# Patient Record
Sex: Male | Born: 1985
Health system: Southern US, Community
[De-identification: ages and names within clinical notes are randomized; demographics above are authoritative.]

## PROBLEM LIST (undated history)

## (undated) DIAGNOSIS — R011 Cardiac murmur, unspecified: Secondary | ICD-10-CM

## (undated) HISTORY — PX: VASECTOMY: SHX75

---

## 2014-07-21 ENCOUNTER — Other Ambulatory Visit: Payer: Self-pay

## 2014-07-21 ENCOUNTER — Emergency Department
Admission: EM | Admit: 2014-07-21 | Discharge: 2014-07-21 | Disposition: A | Discharge status: Home / Self Care | Payer: BLUE CROSS/BLUE SHIELD | Attending: Emergency Medicine | Admitting: Emergency Medicine

## 2014-07-21 ENCOUNTER — Emergency Department: Payer: BLUE CROSS/BLUE SHIELD

## 2014-07-21 ENCOUNTER — Encounter: Payer: Self-pay | Admitting: Emergency Medicine

## 2014-07-21 DIAGNOSIS — R0789 Other chest pain: Secondary | ICD-10-CM | POA: Insufficient documentation

## 2014-07-21 DIAGNOSIS — Z720 Tobacco use: Secondary | ICD-10-CM | POA: Diagnosis not present

## 2014-07-21 DIAGNOSIS — M25512 Pain in left shoulder: Secondary | ICD-10-CM | POA: Diagnosis not present

## 2014-07-21 DIAGNOSIS — R0782 Intercostal pain: Secondary | ICD-10-CM

## 2014-07-21 HISTORY — DX: Cardiac murmur, unspecified: R01.1

## 2014-07-21 LAB — TROPONIN I: Troponin I: <0.03 ng/mL (ref <0.031)

## 2014-07-21 LAB — CBC
HEMATOCRIT: 43.5 % (ref 40.0–52.0)
HEMOGLOBIN: 14.9 g/dL (ref 13.0–18.0)
MCH: 31.2 pg (ref 26.0–34.0)
MCHC: 34.1 g/dL (ref 32.0–36.0)
MCV: 91.5 fL (ref 80.0–100.0)
PLATELETS: 219 10*3/uL (ref 150–440)
RBC: 4.76 MIL/uL (ref 4.40–5.90)
RDW: 12.3 % (ref 11.5–14.5)
WBC: 6.8 10*3/uL (ref 3.8–10.6)

## 2014-07-21 LAB — BASIC METABOLIC PANEL
Anion gap: 9 (ref 5–15)
BUN: 14 mg/dL (ref 6–20)
CO2: 26 mmol/L (ref 22–32)
Calcium: 9.2 mg/dL (ref 8.9–10.3)
Chloride: 103 mmol/L (ref 101–111)
Creatinine, Ser: 0.86 mg/dL (ref 0.61–1.24)
GFR calc Af Amer: >60 mL/min (ref >60)
GFR calc non Af Amer: >60 mL/min (ref >60)
Glucose, Bld: 101 mg/dL — ABNORMAL HIGH (ref 65–99)
Potassium: 4.1 mmol/L (ref 3.5–5.1)
Sodium: 138 mmol/L (ref 135–145)

## 2014-07-21 NOTE — ED Provider Notes (Signed)
[facility] Emergency Department Provider Note  Time seen: 2:16 PM  I have reviewed the triage vital signs and the nursing notes.   HISTORY  Chief Complaint Chest Pain    HPI Jason Stanton is a 29 y.o. male with no past medical problems presents the emergency department with 3 days of right-sided chest pressure/pain. According to the patient for the past 3 days he has had a pain in his right chest worse with movement. Denies any shortness of breath, cough, no pleuritic chest pain. Denies any leg swelling or pain. Patient states his wife made him come to get evaluated to be safe. States his pain as mild to moderate fairly constant dull type pain, somewhat sharp when moving. Patient also has some left shoulder pain which she states is unrelated after hitting his shoulder 2 days ago. Describes his left shoulder pain as mild, moderate when he moves it.     Past Medical History  Diagnosis Date  . Heart murmur     There are no active problems to display for this patient.   History reviewed. No pertinent past surgical history.  No current outpatient prescriptions on file.  Allergies Stadol  No family history on file.  Social History History  Substance Use Topics  . Smoking status: Current Every Day Smoker  . Smokeless tobacco: Not on file  . Alcohol Use: Yes    Review of Systems Constitutional: Negative for fever. Cardiovascular: Positive for right chest pain. Respiratory: Negative for shortness of breath. Gastrointestinal: Negative for abdominal pain Musculoskeletal: Negative for back pain.  10-point ROS otherwise negative.  ____________________________________________   PHYSICAL EXAM:  VITAL SIGNS: ED Triage Vitals  Enc Vitals Group     BP 07/21/14 1242 146/82 mmHg     Pulse Rate 07/21/14 1242 76     Resp 07/21/14 1242 18     Temp 07/21/14 1242 98.3 F (36.8 C)     Temp Source 07/21/14 1242 Oral     SpO2 07/21/14 1242 97 %   Weight 07/21/14 1242 208 lb (94.348 kg)     Height 07/21/14 1242  (1.803 m)     Head Cir --      Peak Flow --      Pain Score 07/21/14 1300 6     Pain Loc --      Pain Edu? --      Excl. in GC? --     Constitutional: Alert and oriented. Well appearing and in no distress. ENT   Mouth/Throat: Mucous membranes are moist. Cardiovascular: Normal rate, regular rhythm. No murmur Respiratory: Normal respiratory effort without tachypnea nor retractions. Breath sounds are clear and equal bilaterally. No wheezes/rales/rhonchi. Moderate tenderness palpation of the right chest with chest compression. Gastrointestinal: Soft and nontender. No distention.   Musculoskeletal: Nontender with normal range of motion in all extremities. No lower extremity  edema. Neurologic:  Normal speech and language. No gross focal neurologic deficits  Skin:  Skin is warm, dry and intact.  Psychiatric: Mood and affect are normal. Speech and behavior are normal.   ____________________________________________    EKG  EKG reviewed and interpreted by myself shows normal sinus rhythm at 80 bpm, narrow QRS, normal axis, normal intervals, no ST changes noted. Overall normal EKG.  ____________________________________________    RADIOLOGY  Chest x-ray within normal limits Left shoulder x-ray negative.  ____________________________________________   INITIAL IMPRESSION / ASSESSMENT AND PLAN / ED COURSE  Pertinent labs & imaging results that were available during my  care of the patient were reviewed by me and considered in my medical decision making (see chart for details).  Patient here for 3 days of right-sided chest pain, somewhat worse with moving. Chest pain is reproducible on exam. Labs are within normal limits including negative cardiac enzymes. Chest x-ray and EKG are also normal. I discussed with the patient likely musculoskeletal cause for his pain. Patient will take Motrin at home, follow-up with his  primary care doctor not improved.   ____________________________________________   FINAL CLINICAL IMPRESSION(S) / ED DIAGNOSES  Chest pain Left shoulder pain Musca skeletal pain   Minna AntisKevin Shanti Eichel, MD 07/21/14 1419

## 2014-07-21 NOTE — ED Notes (Signed)
Patient reports pain to right side of chest x3 days. States he hit his left shoulder a few days ago, has h/o pain to same.

## 2014-07-21 NOTE — Discharge Instructions (Signed)

## 2015-06-08 ENCOUNTER — Emergency Department
Admission: EM | Admit: 2015-06-08 | Discharge: 2015-06-08 | Disposition: A | Discharge status: Home / Self Care | Payer: Managed Care, Other (non HMO) | Attending: Emergency Medicine | Admitting: Emergency Medicine

## 2015-06-08 ENCOUNTER — Emergency Department: Payer: Managed Care, Other (non HMO)

## 2015-06-08 ENCOUNTER — Encounter: Payer: Self-pay | Admitting: Emergency Medicine

## 2015-06-08 DIAGNOSIS — Y939 Activity, unspecified: Secondary | ICD-10-CM | POA: Insufficient documentation

## 2015-06-08 DIAGNOSIS — F17200 Nicotine dependence, unspecified, uncomplicated: Secondary | ICD-10-CM | POA: Diagnosis not present

## 2015-06-08 DIAGNOSIS — Y929 Unspecified place or not applicable: Secondary | ICD-10-CM | POA: Insufficient documentation

## 2015-06-08 DIAGNOSIS — X58XXXA Exposure to other specified factors, initial encounter: Secondary | ICD-10-CM | POA: Insufficient documentation

## 2015-06-08 DIAGNOSIS — S8991XA Unspecified injury of right lower leg, initial encounter: Secondary | ICD-10-CM | POA: Diagnosis present

## 2015-06-08 DIAGNOSIS — Y999 Unspecified external cause status: Secondary | ICD-10-CM | POA: Insufficient documentation

## 2015-06-08 DIAGNOSIS — S86911A Strain of unspecified muscle(s) and tendon(s) at lower leg level, right leg, initial encounter: Secondary | ICD-10-CM

## 2015-06-08 DIAGNOSIS — S86811A Strain of other muscle(s) and tendon(s) at lower leg level, right leg, initial encounter: Secondary | ICD-10-CM | POA: Insufficient documentation

## 2015-06-08 MED ORDER — MELOXICAM 15 MG PO TABS: 15.0000 mg | ORAL_TABLET | Freq: Every day | ORAL | Status: DC

## 2015-06-08 NOTE — ED Notes (Signed)
Developed pain to right knee since Sunday w/o injury

## 2015-06-08 NOTE — ED Provider Notes (Signed)
[facility] Emergency Department Provider Note  ____________________________________________  Time seen: Approximately 2:13 PM  I have reviewed the triage vital signs and the nursing notes.   HISTORY  Chief Complaint Knee Pain    HPI Jason Stanton is a 30 y.o. male presents for evaluation of right knee pain 2 days with no known injury. Patient states that his knee pain is progressively gotten worse over the last 2 days after wrestling with a 75-year-old some carpet but doesn't feel like he didn't have any trauma to his knee.   Past Medical History  Diagnosis Date  . Heart murmur     There are no active problems to display for this patient.   History reviewed. No pertinent past surgical history.  Current Outpatient Rx  Name  Route  Sig  Dispense  Refill  . meloxicam (MOBIC) 15 MG tablet   Oral   Take 1 tablet (15 mg total) by mouth daily.   30 tablet   0     Allergies Stadol  No family history on file.  Social History Social History  Substance Use Topics  . Smoking status: Current Every Day Smoker  . Smokeless tobacco: None  . Alcohol Use: Yes    Review of Systems Constitutional: No fever/chills Musculoskeletal: Positive for right knee pain. Skin: Negative for rash. Neurological: Negative for headaches, focal weakness or numbness.  10-point ROS otherwise negative.  ____________________________________________   PHYSICAL EXAM:  VITAL SIGNS: ED Triage Vitals  Enc Vitals Group     BP 06/08/15 1411 149/88 mmHg     Pulse Rate 06/08/15 1411 78     Resp 06/08/15 1411 18     Temp 06/08/15 1411 98.2 F (36.8 C)     Temp Source 06/08/15 1411 Oral     SpO2 06/08/15 1411 97 %     Weight 06/08/15 1411 214 lb (97.07 kg)     Height 06/08/15 1411  (1.803 m)     Head Cir --      Peak Flow --      Pain Score --      Pain Loc --      Pain Edu? --      Excl. in GC? --     Constitutional: Alert and oriented. Well appearing  and in no acute distress. Musculoskeletal: Minimal right knee tenderness with no edema or effusion noted. No ecchymosis or bruising present. Full range of motion. Neurologic:  Normal speech and language. No gross focal neurologic deficits are appreciated.  Skin:  Skin is warm, dry and intact. No rash noted. Psychiatric: Mood and affect are normal. Speech and behavior are normal.  ____________________________________________   LABS (all labs ordered are listed, but only abnormal results are displayed)  Labs Reviewed - No data to display ____________________________________________  EKG   ____________________________________________  RADIOLOGY  FINDINGS: No evidence of fracture, dislocation, or joint effusion. No evidence of arthropathy or other focal bone abnormality. Soft tissues are unremarkable.  IMPRESSION: Negative right knee radiographs. ____________________________________________   PROCEDURES  Procedure(s) performed: None  Critical Care performed: No  ____________________________________________   INITIAL IMPRESSION / ASSESSMENT AND PLAN / ED COURSE  Pertinent labs & imaging results that were available during my care of the patient were reviewed by me and considered in my medical decision making (see chart for details).  Acute right knee strain. Rx given for meloxicam 15 mg daily. [facility]. Patient voices no  other emergency medical complaints at this time. ____________________________________________   FINAL CLINICAL IMPRESSION(S) / ED DIAGNOSES  Final diagnoses:  Knee strain, right, initial encounter     This chart was dictated using voice recognition software/Dragon. Despite best efforts to proofread, errors can occur which can change the meaning. Any change was purely unintentional.   Evangeline Dakinharles M Beers, PA-C 06/08/15 1734  Charmayne Sheerharles M Beers, PA-C 06/08/15 1735  Jene Everyobert Kinner,  MD 06/11/15 [phone]

## 2016-08-16 ENCOUNTER — Emergency Department
Admission: EM | Admit: 2016-08-16 | Discharge: 2016-08-16 | Disposition: A | Discharge status: Home / Self Care | Payer: PRIVATE HEALTH INSURANCE | Attending: Emergency Medicine | Admitting: Emergency Medicine

## 2016-08-16 ENCOUNTER — Encounter: Payer: Self-pay | Admitting: *Deleted

## 2016-08-16 ENCOUNTER — Emergency Department: Payer: PRIVATE HEALTH INSURANCE

## 2016-08-16 DIAGNOSIS — N23 Unspecified renal colic: Secondary | ICD-10-CM | POA: Diagnosis not present

## 2016-08-16 DIAGNOSIS — F17210 Nicotine dependence, cigarettes, uncomplicated: Secondary | ICD-10-CM | POA: Insufficient documentation

## 2016-08-16 DIAGNOSIS — R109 Unspecified abdominal pain: Secondary | ICD-10-CM | POA: Diagnosis present

## 2016-08-16 LAB — CBC
HCT: 45.1 % (ref 40.0–52.0)
Hemoglobin: 15.9 g/dL (ref 13.0–18.0)
MCH: 32.5 pg (ref 26.0–34.0)
MCHC: 35.2 g/dL (ref 32.0–36.0)
MCV: 92.4 fL (ref 80.0–100.0)
PLATELETS: 241 10*3/uL (ref 150–440)
RBC: 4.88 MIL/uL (ref 4.40–5.90)
RDW: 12.8 % (ref 11.5–14.5)
WBC: 15.4 10*3/uL — ABNORMAL HIGH (ref 3.8–10.6)

## 2016-08-16 LAB — COMPREHENSIVE METABOLIC PANEL
ALBUMIN: 5 g/dL (ref 3.5–5.0)
ALT: 43 U/L (ref 17–63)
AST: 43 U/L — AB (ref 15–41)
Alkaline Phosphatase: 81 U/L (ref 38–126)
Anion gap: 11 (ref 5–15)
BUN: 21 mg/dL — AB (ref 6–20)
CHLORIDE: 105 mmol/L (ref 101–111)
CO2: 26 mmol/L (ref 22–32)
Calcium: 10.1 mg/dL (ref 8.9–10.3)
Creatinine, Ser: 1.56 mg/dL — ABNORMAL HIGH (ref 0.61–1.24)
GFR calc Af Amer: >60 mL/min (ref >60)
GFR calc non Af Amer: 59 mL/min — ABNORMAL LOW (ref >60)
GLUCOSE: 111 mg/dL — AB (ref 65–99)
Potassium: 3.8 mmol/L (ref 3.5–5.1)
SODIUM: 142 mmol/L (ref 135–145)
Total Bilirubin: 0.8 mg/dL (ref 0.3–1.2)
Total Protein: 8.3 g/dL — ABNORMAL HIGH (ref 6.5–8.1)

## 2016-08-16 LAB — URINALYSIS, COMPLETE (UACMP) WITH MICROSCOPIC
Bacteria, UA: NONE SEEN
Bilirubin Urine: NEGATIVE
GLUCOSE, UA: NEGATIVE mg/dL
KETONES UR: 5 mg/dL — AB
Leukocytes, UA: NEGATIVE
Nitrite: NEGATIVE
Protein, ur: 30 mg/dL — AB
Specific Gravity, Urine: 1.033 — ABNORMAL HIGH (ref 1.005–1.030)
Squamous Epithelial / LPF: NONE SEEN
pH: 5 (ref 5.0–8.0)

## 2016-08-16 MED ORDER — LORAZEPAM 2 MG/ML IJ SOLN
1.0000 mg | Freq: Once | INTRAMUSCULAR | Status: AC
  Administered 2016-08-16: 1 mg via INTRAVENOUS
  Filled 2016-08-16: qty 1

## 2016-08-16 MED ORDER — ONDANSETRON 4 MG PO TBDP: 4.0000 mg | ORAL_TABLET | Freq: Three times a day (TID) | ORAL | 0 refills | Status: DC | PRN

## 2016-08-16 MED ORDER — HYDROMORPHONE HCL 1 MG/ML IJ SOLN
INTRAMUSCULAR | Status: AC
  Filled 2016-08-16: qty 1

## 2016-08-16 MED ORDER — KETOROLAC TROMETHAMINE 30 MG/ML IJ SOLN
INTRAMUSCULAR | Status: AC
  Administered 2016-08-16: 30 mg via INTRAVENOUS
  Filled 2016-08-16: qty 1

## 2016-08-16 MED ORDER — OXYCODONE-ACETAMINOPHEN 7.5-325 MG PO TABS: 1.0000 | ORAL_TABLET | ORAL | 0 refills | Status: DC | PRN

## 2016-08-16 MED ORDER — TAMSULOSIN HCL 0.4 MG PO CAPS: 0.4000 mg | ORAL_CAPSULE | Freq: Every day | ORAL | 0 refills | Status: DC

## 2016-08-16 MED ORDER — KETOROLAC TROMETHAMINE 30 MG/ML IJ SOLN
30.0000 mg | Freq: Once | INTRAMUSCULAR | Status: AC
  Administered 2016-08-16: 30 mg via INTRAVENOUS

## 2016-08-16 MED ORDER — HYDROMORPHONE HCL 1 MG/ML IJ SOLN
0.5000 mg | Freq: Once | INTRAMUSCULAR | Status: AC
  Administered 2016-08-16: 0.5 mg via INTRAVENOUS
  Filled 2016-08-16: qty 1

## 2016-08-16 MED ORDER — ONDANSETRON HCL 4 MG/2ML IJ SOLN
4.0000 mg | Freq: Once | INTRAMUSCULAR | Status: AC
  Administered 2016-08-16: 4 mg via INTRAVENOUS

## 2016-08-16 MED ORDER — SODIUM CHLORIDE 0.9 % IV SOLN
Freq: Once | INTRAVENOUS | Status: AC
  Administered 2016-08-16: 17:00:00 via INTRAVENOUS

## 2016-08-16 MED ORDER — ONDANSETRON HCL 4 MG/2ML IJ SOLN
INTRAMUSCULAR | Status: AC
  Administered 2016-08-16: 4 mg via INTRAVENOUS
  Filled 2016-08-16: qty 2

## 2016-08-16 NOTE — ED Notes (Signed)
Pt now resting in bed, skin WNL, speaking in full clear sentences, appears comfortable, talking to family

## 2016-08-16 NOTE — ED Notes (Signed)
Pt returned from CT °

## 2016-08-16 NOTE — ED Triage Notes (Signed)
Pt ambulatory to triage.  Pt has right side flank pain for 45 minutes.  Pt has nausea. Pt cool, clammy and pale.

## 2016-08-16 NOTE — ED Notes (Signed)
Pt unable to sit still, rhything and moaning in pain, states flank pain that began a few hours ago, denies any hx of kidney stones, pt taken to CT, family at bedside

## 2016-08-16 NOTE — ED Provider Notes (Signed)
[facility] Emergency Department Provider Note       Time seen: ----------------------------------------- 4:12 PM on 08/16/2016 -----------------------------------------     I have reviewed the triage vital signs and the nursing notes.   HISTORY   Chief Complaint Flank Pain    HPI Billy FischerJacob Blacklock is a 31 y.o. male who presents to the ED for right flank pain for the past 45 minutes. Patient describes sharp and stabbing pain with nausea. Nothing makes his pain better or worse. He has never had this before, follow her grandfather have all had kidney stones. He has had diaphoresis.   Past Medical History:  Diagnosis Date  . Heart murmur     There are no active problems to display for this patient.   No past surgical history on file.  Allergies Stadol [butorphanol]  Social History Social History  Substance Use Topics  . Smoking status: Current Every Day Smoker  . Smokeless tobacco: Never Used  . Alcohol use Yes    Review of Systems Constitutional: Negative for fever. Eyes: Negative for vision changes ENT:  Negative for congestion, sore throat Cardiovascular: Negative for chest pain. Respiratory: Negative for shortness of breath. Gastrointestinal: Positive for right flank pain, nausea Genitourinary: Negative for dysuria. Musculoskeletal: Negative for back pain. Skin: Positive for diaphoresis Neurological: Negative for headaches, focal weakness or numbness.  All systems negative/normal/unremarkable except as stated in the HPI  ____________________________________________   PHYSICAL EXAM:  VITAL SIGNS: ED Triage Vitals  Enc Vitals Group     BP 08/16/16 1602 (!) 169/74     Pulse Rate 08/16/16 1602 78     Resp 08/16/16 1602 20     Temp 08/16/16 1602 (!) 97.5 F (36.4 C)     Temp Source 08/16/16 1602 Oral     SpO2 08/16/16 1602 99 %     Weight 08/16/16 1600 210 lb (95.3 kg)     Height 08/16/16 1600 6' (1.829 m)     Head  Circumference --      Peak Flow --      Pain Score 08/16/16 1559 10     Pain Loc --      Pain Edu? --      Excl. in GC? --     Constitutional: Alert and oriented. Mild to moderate distress Eyes: Conjunctivae are normal. Normal extraocular movements. ENT   Head: Normocephalic and atraumatic.   Nose: No congestion/rhinnorhea.   Mouth/Throat: Mucous membranes are moist.   Neck: No stridor. Cardiovascular: Normal rate, regular rhythm. No murmurs, rubs, or gallops. Respiratory: Normal respiratory effort without tachypnea nor retractions. Breath sounds are clear and equal bilaterally. No wheezes/rales/rhonchi. Gastrointestinal: Right flank tenderness, no rebound or guarding. Normal bowel sounds. Musculoskeletal: Nontender with normal range of motion in extremities. No lower extremity tenderness nor edema. Neurologic:  Normal speech and language. No gross focal neurologic deficits are appreciated.  Skin:  Skin is warm, with diaphoresis Psychiatric: Mood and affect are normal. Speech and behavior are normal.  ____________________________________________  ED COURSE:  Pertinent labs & imaging results that were available during my care of the patient were reviewed by me and considered in my medical decision making (see chart for details). Patient presents for flank pain likely secondary to renal colic, we will assess with labs and imaging as indicated.   Procedures ____________________________________________   LABS (pertinent positives/negatives)  Labs Reviewed  CBC - Abnormal; Notable for the following:       Result Value   WBC 15.4 (*)  All other components within normal limits  COMPREHENSIVE METABOLIC PANEL - Abnormal; Notable for the following:    Glucose, Bld 111 (*)    BUN 21 (*)    Creatinine, Ser 1.56 (*)    Total Protein 8.3 (*)    AST 43 (*)    GFR calc non Af Amer 59 (*)    All other components within normal limits  URINALYSIS, COMPLETE (UACMP) WITH  MICROSCOPIC - Abnormal; Notable for the following:    Color, Urine YELLOW (*)    APPearance HAZY (*)    Specific Gravity, Urine 1.033 (*)    Hgb urine dipstick LARGE (*)    Ketones, ur 5 (*)    Protein, ur 30 (*)    All other components within normal limits    RADIOLOGY Images were viewed by me  CT renal protocol IMPRESSION: Mildly obstructing 2 mm right distal ureteral calculus with mild right hydroureteronephrosis.  Probable 2.5 cm right renal cyst  Punctate tiny nonobstructing left nephrolithiasis. ____________________________________________  FINAL ASSESSMENT AND PLAN  Renal colic  Plan: Patient's labs and imaging were dictated above. Patient had presented for Severe right flank pain secondary to renal colic. He did have a 2 mm distal stone on the right which she should be able to pass. He'll be discharged with pain medicine and Flomax. He was given a liter of fluids here as well as Toradol and Dilaudid for pain.   Emily FilbertWilliams, Haddon Fyfe E, MD   Note: This note was generated in part or whole with voice recognition software. Voice recognition is usually quite accurate but there are transcription errors that can and very often do occur. I apologize for any typographical errors that were not detected and corrected.     Emily FilbertWilliams, Lakashia Collison E, MD 08/16/16 [phone]

## 2017-01-30 ENCOUNTER — Ambulatory Visit: Payer: Self-pay | Admitting: Physician Assistant

## 2017-02-13 ENCOUNTER — Ambulatory Visit (INDEPENDENT_AMBULATORY_CARE_PROVIDER_SITE_OTHER): Payer: PRIVATE HEALTH INSURANCE | Admitting: Physician Assistant

## 2017-02-13 ENCOUNTER — Encounter: Payer: Self-pay | Admitting: Physician Assistant

## 2017-02-13 VITALS — BP 128/84 | HR 72 | Temp 97.8°F | Resp 16 | Ht 72.0 in | Wt 224.0 lb

## 2017-02-13 DIAGNOSIS — Z1329 Encounter for screening for other suspected endocrine disorder: Secondary | ICD-10-CM | POA: Diagnosis not present

## 2017-02-13 DIAGNOSIS — M25541 Pain in joints of right hand: Secondary | ICD-10-CM

## 2017-02-13 DIAGNOSIS — M25542 Pain in joints of left hand: Secondary | ICD-10-CM

## 2017-02-13 DIAGNOSIS — Z13220 Encounter for screening for lipoid disorders: Secondary | ICD-10-CM

## 2017-02-13 NOTE — Progress Notes (Signed)
Patient: Jason Stanton Male    DOB: [birth year]   32 y.o.   MRN: [id number] Visit Date: 02/13/2017  Today's Provider: Trinna Post, PA-C   Chief Complaint  Patient presents with  . Establish Care  . Pain    Joint pain for several months.    Subjective:    Jason Stanton presents today to establish care, he has no prior PCP. He is living in [place], living with wife and two children. Ages 59 and 2, no health issues.   UTD flu shot. Tetanus shot within past ten years.    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   History of kidney stones, treated with flomax, likely passed it. Previous history of 6 stones in the past two months, does not want urology referral.   Sexually active, no concern for STI.  1/2 pack per day x 8 years total. Not ready to quit. Drinks 1 drink per night, no drugs.   Joint pain three months in bilateral hands and wrists. He reports pain throughout the day, sporadically. He denies morning stiffness, denies joint swelling. He has a mom with RA. He had a previous right hand injury when 19, seems to have broken 4th MCP.  Arthritis  Presents for initial visit. The disease course has been worsening. He complains of pain, stiffness and joint swelling. (Worse in hands and wrists.) His family medical history includes family history of rheumatoid arthritis. (Pt reports mom has a history of R/A) Past treatments include OTC med. The treatment provided mild relief.       Allergies  Allergen Reactions  . Stadol [Butorphanol] Anaphylaxis     Current Outpatient Medications:  .  famotidine (PEPCID) 20 MG tablet, Take 20 mg by mouth daily., Disp: , Rfl:  .  naproxen sodium (ALEVE) 220 MG tablet, Take 220 mg by mouth., Disp: , Rfl:  .  meloxicam (MOBIC) 15 MG tablet, Take 1 tablet (15 mg total) by mouth daily., Disp: 30 tablet, Rfl: 0 .  ondansetron (ZOFRAN ODT) 4 MG disintegrating tablet, Take 1 tablet (4 mg total) by mouth every 8 (eight) hours as  needed for nausea or vomiting., Disp: 20 tablet, Rfl: 0 .  oxyCODONE-acetaminophen (PERCOCET) 7.5-325 MG tablet, Take 1 tablet by mouth every 4 (four) hours as needed for severe pain., Disp: 20 tablet, Rfl: 0 .  tamsulosin (FLOMAX) 0.4 MG CAPS capsule, Take 1 capsule (0.4 mg total) by mouth daily after breakfast., Disp: 30 capsule, Rfl: 0  Review of Systems  Constitutional: Negative.   HENT: Negative.   Eyes: Negative.   Respiratory: Negative.   Cardiovascular: Negative.   Gastrointestinal: Negative.   Endocrine: Negative.   Genitourinary: Negative.   Musculoskeletal: Positive for arthralgias, arthritis, joint swelling and stiffness.  Allergic/Immunologic: Negative.   Neurological: Negative.   Hematological: Negative.   Psychiatric/Behavioral: Negative.     Social History   Tobacco Use  . Smoking status: Current Every Day Smoker    Packs/day: 0.50    Years: 2.00    Pack years: 1.00  . Smokeless tobacco: Never Used  Substance Use Topics  . Alcohol use: Yes    Alcohol/week: 0.6 - 1.2 oz    Types: 1 - 2 Cans of beer per week   Objective:   BP 128/84 (BP Location: Right Arm, Patient Position: Sitting, Cuff Size: Large)   Pulse 72   Temp 97.8 F (36.6 C) (Oral)   Resp 16   Ht 6' (1.829 m)  Wt 224 lb (101.6 kg)   BMI 30.38 kg/m  Vitals:   02/13/17 1512  BP: 128/84  Pulse: 72  Resp: 16  Temp: 97.8 F (36.6 C)  TempSrc: Oral  Weight: 224 lb (101.6 kg)  Height: 6' (1.829 m)     Physical Exam  Constitutional: He is oriented to person, place, and time. He appears well-developed and well-nourished.  HENT:  Right Ear: External ear normal.  Left Ear: External ear normal.  Mouth/Throat: Oropharynx is clear and moist. No oropharyngeal exudate.  Eyes: Conjunctivae are normal.  Neck: Neck supple.  Cardiovascular: Normal rate and regular rhythm.  Pulmonary/Chest: Effort normal and breath sounds normal.  Abdominal: Soft. Bowel sounds are normal.  Musculoskeletal: He  exhibits no edema, tenderness or deformity.  No swelling in hands bilaterally, no boggy joints. No pain with MCP compression test.   Lymphadenopathy:    He has no cervical adenopathy.  Neurological: He is alert and oriented to person, place, and time.  Skin: Skin is warm and dry.  Psychiatric: He has a normal mood and affect. His behavior is normal.        Assessment & Plan:     1. Arthralgia of both hands  Patient concerned about cost, will defer Xrays and get laboratory evaluation. Does not sound very typical of RA, but patient concerned and does have family history. Can pursue hand xrays if labwork positive or pain persists.   - C-reactive protein - Sed Rate (ESR) - CBC With Differential - Comprehensive Metabolic Panel (CMET) - Rheumatoid Factor  2. Screening for thyroid disorder  - TSH  3. Screening, lipid  - Lipid Profile  Return in about 1 year (around 02/13/2018) for CPE .  The entirety of the information documented in the History of Present Illness, Review of Systems and Physical Exam were personally obtained by me. Portions of this information were initially documented by Ashley Royalty, CMA and reviewed by me for thoroughness and accuracy.         Trinna Post, PA-C  [facility]

## 2017-02-14 NOTE — Patient Instructions (Signed)
Rheumatoid Arthritis Rheumatoid arthritis (RA) is a long-term (chronic) disease. RA causes inflammation in your joints. Your joints may feel painful, stiff, swollen, warm, or tender. RA may start slowly. Usually, it affects the small joints of the hands and feet. It can also affect other parts of the body, even the heart, eyes, or lungs. Symptoms of RA often come and go. Sometimes, symptoms get worse for a while. These are called flares. There is no cure for RA, but your doctor will work with you to find the best treatment option for you. This will depend on how the disease is changing in your body. Follow these instructions at home:  Take over-the-counter and prescription medicines only as told by your doctor. Your doctor may change (adjust) your medicines every 3 months.  Start an exercise program as told by your doctor.  Rest when you have a flare.  Return to your normal activities as told by your doctor. Ask your doctor what activities are safe for you.  Keep all follow-up visits as told by your doctor. This is important. Contact a doctor if:  You have a flare.  You have a fever.  You have problems (side effects) because of your medicines. Get help right away if:  You have chest pain.  You have trouble breathing.  You have a hot, painful joint all of a sudden, and it is worse than your usual joint aches. This information is not intended to replace advice given to you by your health care provider. Make sure you discuss any questions you have with your health care provider. Document Released: 03/27/2011 Document Revised: 06/10/2015 Document Reviewed: 10/15/2014 Elsevier Interactive Patient Education  2018 [employer]  

## 2017-08-03 ENCOUNTER — Encounter: Payer: Self-pay | Admitting: Physician Assistant

## 2017-08-03 ENCOUNTER — Ambulatory Visit: Payer: 59 | Admitting: Physician Assistant

## 2017-08-03 VITALS — BP 138/96 | HR 84 | Temp 97.9°F | Wt 219.4 lb

## 2017-08-03 DIAGNOSIS — I10 Essential (primary) hypertension: Secondary | ICD-10-CM

## 2017-08-03 MED ORDER — AMLODIPINE BESYLATE 5 MG PO TABS: 5.0000 mg | ORAL_TABLET | Freq: Every day | ORAL | 1 refills | Status: DC

## 2017-08-03 NOTE — Progress Notes (Signed)
Patient: Jason Stanton Male    DOB: [birth year]   32 y.o.   MRN: [id number] Visit Date: 08/03/2017  Today's Provider: Margaretann LovelessJennifer M Burnette, PA-C   Chief Complaint  Patient presents with  . Hypertension   Subjective:    Hypertension  This is a new problem. Episode onset: approximately 8-9 days. The problem is uncontrolled. Associated symptoms include anxiety. Pertinent negatives include no blurred vision, chest pain, headaches, malaise/fatigue, neck pain, orthopnea, palpitations, peripheral edema, PND, shortness of breath or sweats. Agents associated with hypertension include NSAIDs (Aleve almost every day). Risk factors for coronary artery disease include male gender, obesity, smoking/tobacco exposure and family history. Past treatments include nothing.  Patient states he has been checking BP at home and it has been running 150's/90's.  He does report increased stressors at home and work. His ex-wife just got custody of his oldest son and has him all summer. This is the first summer away. He also reports his dad recently told him he was being worked up to see if he had a previous stroke. He also stresses at work and works outside in the heat and humidity.     Allergies  Allergen Reactions  . Stadol [Butorphanol] Anaphylaxis     Current Outpatient Medications:  .  famotidine (PEPCID) 20 MG tablet, Take 20 mg by mouth daily., Disp: , Rfl:  .  naproxen sodium (ALEVE) 220 MG tablet, Take 220 mg by mouth., Disp: , Rfl:   Review of Systems  Constitutional: Negative.  Negative for malaise/fatigue.  Eyes: Negative for blurred vision.  Respiratory: Negative.  Negative for shortness of breath.   Cardiovascular: Negative.  Negative for chest pain, palpitations, orthopnea and PND.  Musculoskeletal: Negative for neck pain.  Neurological: Negative for headaches.    Social History   Tobacco Use  . Smoking status: Current Every Day Smoker    Packs/day: 0.50    Years: 2.00    Pack  years: 1.00  . Smokeless tobacco: Never Used  Substance Use Topics  . Alcohol use: Yes    Alcohol/week: 0.6 - 1.2 oz    Types: 1 - 2 Cans of beer per week   Objective:   BP (!) 138/96 (BP Location: Right Arm, Patient Position: Sitting, Cuff Size: Large)   Pulse 84   Temp 97.9 F (36.6 C) (Oral)   Wt 219 lb 6.4 oz (99.5 kg)   SpO2 98%   BMI 29.76 kg/m    Physical Exam  Constitutional: He appears well-developed and well-nourished. No distress.  HENT:  Head: Normocephalic and atraumatic.  Neck: Normal range of motion. Neck supple.  Cardiovascular: Normal rate, regular rhythm and normal heart sounds. Exam reveals no gallop and no friction rub.  No murmur heard. Pulmonary/Chest: Effort normal and breath sounds normal. No respiratory distress. He has no wheezes. He has no rales.  Musculoskeletal: He exhibits no edema.  Skin: He is not diaphoretic.  Vitals reviewed.      Assessment & Plan:     1. Essential hypertension Elevated. Suspect due to stressors, aleve and smoking. He is trying to quit smoking and has a quit date of 08/31/17. Discussed trying to alternate aleve and tylenol to cut back usage. Will also start low dose amlodipine as below. I will see him back in 4-6 weeks for BP recheck.  - amLODipine (NORVASC) 5 MG tablet; Take 1 tablet (5 mg total) by mouth daily.  Dispense: 90 tablet; Refill: 1       Victorino DikeJennifer  Dorothy Puffer, PA-C  [facility]

## 2017-08-03 NOTE — Patient Instructions (Signed)
Amlodipine tablets °What is this medicine? °AMLODIPINE (am LOE di peen) is a calcium-channel blocker. It affects the amount of calcium found in your heart and muscle cells. This relaxes your blood vessels, which can reduce the amount of work the heart has to do. This medicine is used to lower high blood pressure. It is also used to prevent chest pain. °This medicine may be used for other purposes; ask your health care provider or pharmacist if you have questions. °COMMON BRAND NAME(S): Norvasc °What should I tell my health care provider before I take this medicine? °They need to know if you have any of these conditions: °-heart problems like heart failure or aortic stenosis °-liver disease °-an unusual or allergic reaction to amlodipine, other medicines, foods, dyes, or preservatives °-pregnant or trying to get pregnant °-breast-feeding °How should I use this medicine? °Take this medicine by mouth with a glass of water. Follow the directions on the prescription label. Take your medicine at regular intervals. Do not take more medicine than directed. °Talk to your pediatrician regarding the use of this medicine in children. Special care may be needed. This medicine has been used in children as young as 6. °Persons over 65 years old may have a stronger reaction to this medicine and need smaller doses. °Overdosage: If you think you have taken too much of this medicine contact a poison control center or emergency room at once. °NOTE: This medicine is only for you. Do not share this medicine with others. °What if I miss a dose? °If you miss a dose, take it as soon as you can. If it is almost time for your next dose, take only that dose. Do not take double or extra doses. °What may interact with this medicine? °-herbal or dietary supplements °-local or general anesthetics °-medicines for high blood pressure °-medicines for prostate problems °-rifampin °This list may not describe all possible interactions. Give your health  care provider a list of all the medicines, herbs, non-prescription drugs, or dietary supplements you use. Also tell them if you smoke, drink alcohol, or use illegal drugs. Some items may interact with your medicine. °What should I watch for while using this medicine? °Visit your doctor or health care professional for regular check ups. Check your blood pressure and pulse rate regularly. Ask your health care professional what your blood pressure and pulse rate should be, and when you should contact him or her. °This medicine may make you feel confused, dizzy or lightheaded. Do not drive, use machinery, or do anything that needs mental alertness until you know how this medicine affects you. To reduce the risk of dizzy or fainting spells, do not sit or stand up quickly, especially if you are an older patient. Avoid alcoholic drinks; they can make you more dizzy. °Do not suddenly stop taking amlodipine. Ask your doctor or health care professional how you can gradually reduce the dose. °What side effects may I notice from receiving this medicine? °Side effects that you should report to your doctor or health care professional as soon as possible: °-allergic reactions like skin rash, itching or hives, swelling of the face, lips, or tongue °-breathing problems °-changes in vision or hearing °-chest pain °-fast, irregular heartbeat °-swelling of legs or ankles °Side effects that usually do not require medical attention (report to your doctor or health care professional if they continue or are bothersome): °-dry mouth °-facial flushing °-nausea, vomiting °-stomach gas, pain °-tired, weak °-trouble sleeping °This list may not describe all possible side   effects. Call your doctor for medical advice about side effects. You may report side effects to FDA at 1-800-FDA-1088. °Where should I keep my medicine? °Keep out of the reach of children. °Store at room temperature between 59 and 86 degrees F (15 and 30 degrees C). Protect from  light. Keep container tightly closed. Throw away any unused medicine after the expiration date. °NOTE: This sheet is a summary. It may not cover all possible information. If you have questions about this medicine, talk to your doctor, pharmacist, or health care provider. °© 2018 Elsevier/Gold Standard (2011-12-01 11:40:58) ° °

## 2017-09-07 ENCOUNTER — Ambulatory Visit: Payer: 59 | Admitting: Physician Assistant

## 2017-09-21 ENCOUNTER — Ambulatory Visit: Payer: 59 | Admitting: Physician Assistant

## 2017-10-02 ENCOUNTER — Encounter: Payer: Self-pay | Admitting: Family Medicine

## 2017-10-02 ENCOUNTER — Ambulatory Visit: Payer: 59 | Admitting: Family Medicine

## 2017-10-02 VITALS — BP 150/90 | HR 95 | Temp 98.7°F | Resp 16 | Wt 226.8 lb

## 2017-10-02 DIAGNOSIS — B349 Viral infection, unspecified: Secondary | ICD-10-CM

## 2017-10-02 MED ORDER — HYDROCODONE-HOMATROPINE 5-1.5 MG/5ML PO SYRP: 5.0000 mL | ORAL_SOLUTION | Freq: Four times a day (QID) | ORAL | 0 refills | Status: AC | PRN

## 2017-10-02 MED ORDER — DOXYCYCLINE HYCLATE 100 MG PO TABS: 100.0000 mg | ORAL_TABLET | Freq: Two times a day (BID) | ORAL | 1 refills | Status: DC

## 2017-10-02 NOTE — Progress Notes (Signed)
  Subjective:     Patient ID: Jason Stanton, male   DOB: [birth year], 32 y.o.   MRN: [id number] Chief Complaint  Patient presents with  . URI    Patient here today with c/o URI symptoms. Patient reports that his symptoms started on Friday night. He has a cough, runnynose, chest tightness, sore throat when coughing, it hurts to swallow. Associated symptoms: back pain-mid section, not sleeoing well, postnasal drip, chills,sinus pain, aching all over. No fever.Treatments tried at home: gargles salt water, Tylenol cold and Flu,Nyquil, chest rub.   HPI State he spends most of his time outside at work and has a high possibility of tick exposure. Has decreased smoking while ill.  Review of Systems     Objective:   Physical Exam  Constitutional: He appears well-developed and well-nourished. He has a sickly appearance.  Ears: T.M's intact without inflammation Throat: no tonsillar enlargement or exudate Neck: no cervical adenopathy Lungs: clear     Assessment:    1. Viral syndrome: will cover for tick fever as well - doxycycline (VIBRA-TABS) 100 MG tablet; Take 1 tablet (100 mg total) by mouth 2 (two) times daily.  Dispense: 14 tablet; Refill: 1 - HYDROcodone-homatropine (HYCODAN) 5-1.5 MG/5ML syrup; Take 5 mLs by mouth every 6 (six) hours as needed for up to 5 days. 5 ml 4-6 hours as needed for cough  Dispense: 100 mL; Refill: 0    Plan:    May continue otc cold preparations. Work excuse for 9/17-9/20/19.

## 2017-10-02 NOTE — Patient Instructions (Signed)
Continue Tylenol for cold and flu.

## 2017-10-05 ENCOUNTER — Ambulatory Visit (INDEPENDENT_AMBULATORY_CARE_PROVIDER_SITE_OTHER): Payer: 59 | Admitting: Physician Assistant

## 2017-10-05 ENCOUNTER — Encounter: Payer: Self-pay | Admitting: Physician Assistant

## 2017-10-05 VITALS — BP 150/100 | HR 94 | Temp 99.7°F | Resp 16 | Wt 227.0 lb

## 2017-10-05 DIAGNOSIS — Z716 Tobacco abuse counseling: Secondary | ICD-10-CM | POA: Diagnosis not present

## 2017-10-05 DIAGNOSIS — J40 Bronchitis, not specified as acute or chronic: Secondary | ICD-10-CM | POA: Diagnosis not present

## 2017-10-05 DIAGNOSIS — I10 Essential (primary) hypertension: Secondary | ICD-10-CM

## 2017-10-05 MED ORDER — AMLODIPINE BESYLATE 10 MG PO TABS
10.0000 mg | ORAL_TABLET | Freq: Every day | ORAL | 3 refills | Status: DC
Start: 2017-10-05 — End: 2017-10-26

## 2017-10-05 MED ORDER — ALBUTEROL SULFATE HFA 108 (90 BASE) MCG/ACT IN AERS
2.0000 | INHALATION_SPRAY | Freq: Four times a day (QID) | RESPIRATORY_TRACT | 0 refills | Status: DC | PRN
Start: 2017-10-05 — End: 2018-02-08

## 2017-10-05 MED ORDER — AMOXICILLIN-POT CLAVULANATE 875-125 MG PO TABS: 1.0000 | ORAL_TABLET | Freq: Two times a day (BID) | ORAL | 0 refills | Status: DC

## 2017-10-05 MED ORDER — PREDNISONE 10 MG (21) PO TBPK: ORAL_TABLET | ORAL | 0 refills | Status: DC

## 2017-10-05 MED ORDER — NICOTINE 21 MG/24HR TD PT24: 21.0000 mg | MEDICATED_PATCH | Freq: Every day | TRANSDERMAL | 0 refills | Status: DC

## 2017-10-05 NOTE — Progress Notes (Signed)
Patient: Jason Stanton Male    DOB: February 20, 1985   32 y.o.   MRN: [id number] Visit Date: 10/05/2017  Today's Provider: Margaretann Loveless, PA-C   Chief Complaint  Patient presents with  . Follow-up    BP   Subjective:    HPI  Hypertension, follow-up:  BP Readings from Last 3 Encounters:  10/05/17 (!) 150/100  10/02/17 (!) 150/90  08/03/17 (!) 138/96    He was last seen for hypertension 2 months ago.  BP at that visit was 138/96. Management since that visit includes Amlodipine 5mg  He reports excellent compliance with treatment. He is not having side effects.  He is not exercising. He is not adherent to low salt diet.   Outside blood pressures are 140's/90's He is experiencing none.  Patient denies chest pain, chest pressure/discomfort, exertional chest pressure/discomfort, fatigue, irregular heart beat, lower extremity edema and palpitations.   Cardiovascular risk factors include hypertension, male gender and smoking/ tobacco exposure.  Use of agents associated with hypertension: none.     Weight trend: stable Wt Readings from Last 3 Encounters:  10/05/17 227 lb (103 kg)  10/02/17 226 lb 12.8 oz (102.9 kg)  08/03/17 219 lb 6.4 oz (99.5 kg)     Patient reports that he still not feeling well. Reports worsening cough, back hurts,chest tightness.Patient still taking Doxy and Hycodan.Patient reports that he had a temperature of 100.1 last night.  ------------------------------------------------------------------------     Allergies  Allergen Reactions  . Stadol [Butorphanol] Anaphylaxis     Current Outpatient Medications:  .  amLODipine (NORVASC) 5 MG tablet, Take 1 tablet (5 mg total) by mouth daily., Disp: 90 tablet, Rfl: 1 .  doxycycline (VIBRA-TABS) 100 MG tablet, Take 1 tablet (100 mg total) by mouth 2 (two) times daily., Disp: 14 tablet, Rfl: 1 .  famotidine (PEPCID) 20 MG tablet, Take 20 mg by mouth daily., Disp: , Rfl:  .   HYDROcodone-homatropine (HYCODAN) 5-1.5 MG/5ML syrup, Take 5 mLs by mouth every 6 (six) hours as needed for up to 5 days. 5 ml 4-6 hours as needed for cough, Disp: 100 mL, Rfl: 0 .  naproxen sodium (ALEVE) 220 MG tablet, Take 220 mg by mouth., Disp: , Rfl:   Review of Systems  Constitutional: Negative.   HENT: Positive for congestion, postnasal drip and rhinorrhea.   Respiratory: Positive for cough, chest tightness, shortness of breath and wheezing.   Cardiovascular: Negative for chest pain, palpitations and leg swelling.  Musculoskeletal: Positive for back pain.  Neurological: Positive for headaches.    Social History   Tobacco Use  . Smoking status: Current Every Day Smoker    Packs/day: 0.50    Years: 2.00    Pack years: 1.00  . Smokeless tobacco: Never Used  Substance Use Topics  . Alcohol use: Yes    Alcohol/week: 1.0 - 2.0 standard drinks    Types: 1 - 2 Cans of beer per week   Objective:   BP (!) 150/100 (BP Location: Left Arm, Patient Position: Sitting, Cuff Size: Large)   Pulse 94   Temp 99.7 F (37.6 C) (Oral) Comment: Took IBU 8 pm  Resp 16   Wt 227 lb (103 kg)   SpO2 98%   BMI 30.79 kg/m  Vitals:   10/05/17 1040  BP: (!) 150/100  Pulse: 94  Resp: 16  Temp: 99.7 F (37.6 C)  TempSrc: Oral  SpO2: 98%  Weight: 227 lb (103 kg)     Physical  Exam  Constitutional: He appears well-developed and well-nourished. No distress.  HENT:  Head: Normocephalic and atraumatic.  Right Ear: Hearing, tympanic membrane, external ear and ear canal normal.  Left Ear: Hearing, tympanic membrane, external ear and ear canal normal.  Nose: Nose normal.  Mouth/Throat: Uvula is midline, oropharynx is clear and moist and mucous membranes are normal. No oropharyngeal exudate.  Eyes: Pupils are equal, round, and reactive to light. Conjunctivae and EOM are normal. Right eye exhibits no discharge. Left eye exhibits no discharge.  Neck: Normal range of motion. Neck supple. No JVD  present. No tracheal deviation present. No Brudzinski's sign and no Kernig's sign noted. No thyromegaly present.  Cardiovascular: Normal rate, regular rhythm and normal heart sounds. Exam reveals no gallop and no friction rub.  No murmur heard. Pulmonary/Chest: Effort normal. No stridor. No respiratory distress. He has wheezes (throughout). He has no rhonchi. He has no rales. He exhibits no tenderness.  Lymphadenopathy:    He has no cervical adenopathy.  Skin: Skin is warm and dry.  Vitals reviewed.       Assessment & Plan:     1. Essential hypertension Elevated. Increase amlodipine to 10mg  as below. I will see him back in 3 months for CPE.  - amLODipine (NORVASC) 10 MG tablet; Take 1 tablet (10 mg total) by mouth daily.  Dispense: 90 tablet; Refill: 3  2. Bronchitis Worsening. Will stop doxycycline. Start augmentin, prednisone and albuterol inhaler. Call if continues to worsen.  - predniSONE (STERAPRED UNI-PAK 21 TAB) 10 MG (21) TBPK tablet; 6 day taper; take as directed on package instructions  Dispense: 21 tablet; Refill: 0 - amoxicillin-clavulanate (AUGMENTIN) 875-125 MG tablet; Take 1 tablet by mouth 2 (two) times daily.  Dispense: 20 tablet; Refill: 0 - albuterol (PROVENTIL HFA;VENTOLIN HFA) 108 (90 Base) MCG/ACT inhaler; Inhale 2 puffs into the lungs every 6 (six) hours as needed for wheezing or shortness of breath.  Dispense: 1 Inhaler; Refill: 0  3. Encounter for smoking cessation counseling States today is his birthday and today is his quit day. Would like to use nicotine patches to help with quitting. Info verbally given for [place] Quit line for assistance if needed. Smoking cessation conversation lasted approx 3 minutes.  - nicotine (NICODERM CQ - DOSED IN MG/24 HOURS) 21 mg/24hr patch; Place 1 patch (21 mg total) onto the skin daily.  Dispense: 28 patch; Refill: 0       Margaretann LovelessJennifer M Shamarcus Hoheisel, PA-C  [facility]

## 2017-10-05 NOTE — Patient Instructions (Signed)

## 2017-10-26 ENCOUNTER — Other Ambulatory Visit: Payer: Self-pay | Admitting: Physician Assistant

## 2017-10-26 DIAGNOSIS — I10 Essential (primary) hypertension: Secondary | ICD-10-CM

## 2017-10-26 MED ORDER — AMLODIPINE BESYLATE 10 MG PO TABS: 10.0000 mg | ORAL_TABLET | Freq: Every day | ORAL | 3 refills | Status: DC

## 2017-10-26 NOTE — Telephone Encounter (Signed)
I'm not sure it this is Jenni's patient or Adriana's. Please advise.

## 2017-10-26 NOTE — Telephone Encounter (Signed)
amLODipine (NORVASC) 10 MG tablet Pt needing this called in because he is now finished with the 5 mg.  Please call into:  [facility] [address], [place] - [address] [phone] (Phone) [phone] (Fax)   Thanks, [facility]

## 2017-10-30 ENCOUNTER — Other Ambulatory Visit: Payer: Self-pay | Admitting: Physician Assistant

## 2017-10-30 DIAGNOSIS — Z716 Tobacco abuse counseling: Secondary | ICD-10-CM

## 2017-12-12 ENCOUNTER — Other Ambulatory Visit: Payer: Self-pay | Admitting: Physician Assistant

## 2017-12-12 DIAGNOSIS — I10 Essential (primary) hypertension: Secondary | ICD-10-CM

## 2017-12-13 IMAGING — CT CT RENAL STONE PROTOCOL
2 of 4 series · 16 of 46 positions shown, 18 images · non-contrast
Comparison: None available

CLINICAL DATA: Acute right flank pain with nausea

EXAM:
CT ABDOMEN AND PELVIS WITHOUT CONTRAST
TECHNIQUE: Multidetector CT imaging of the abdomen and pelvis was performed
following the standard protocol without IV contrast.

[Series 2: stone full standard · axial · 0.81mm/px · z∈[-1295,-835]mm · 13 of 100 slices shown, 15 images]
[im 4/100  soft-tissue]
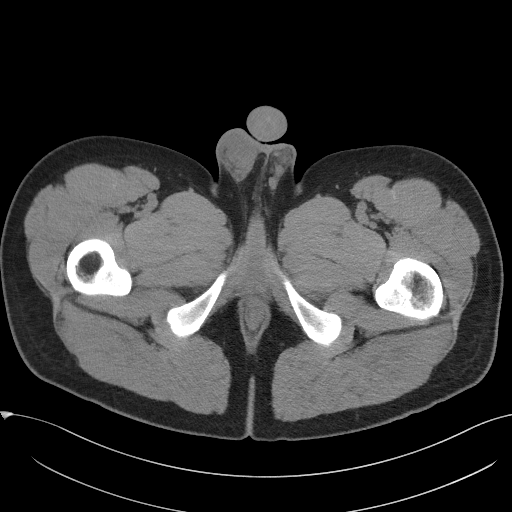
[im 4/100  bone]
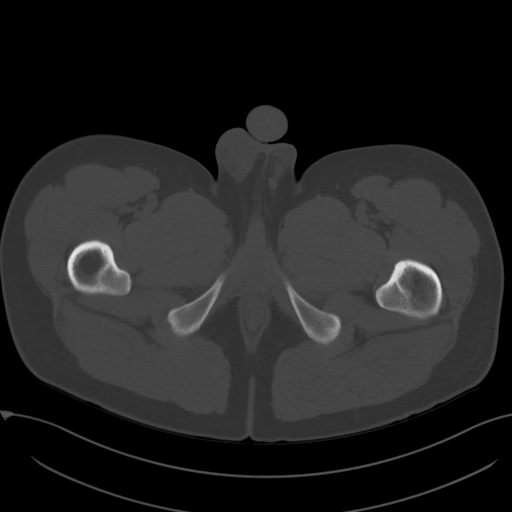
[im 12/100  soft-tissue]
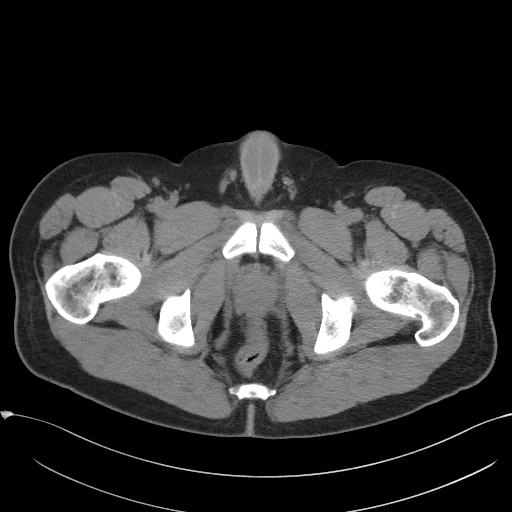
[im 20/100  soft-tissue]
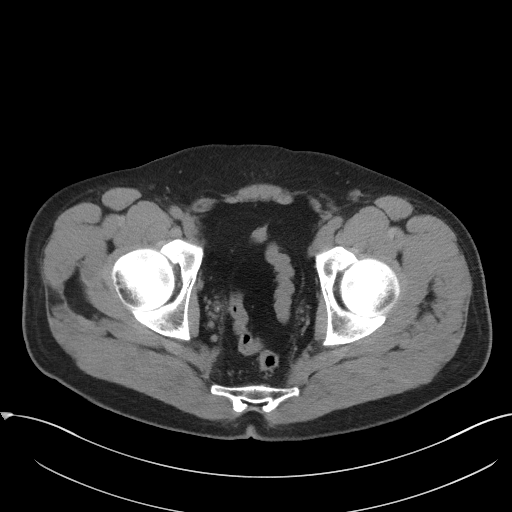
[im 28/100  soft-tissue]
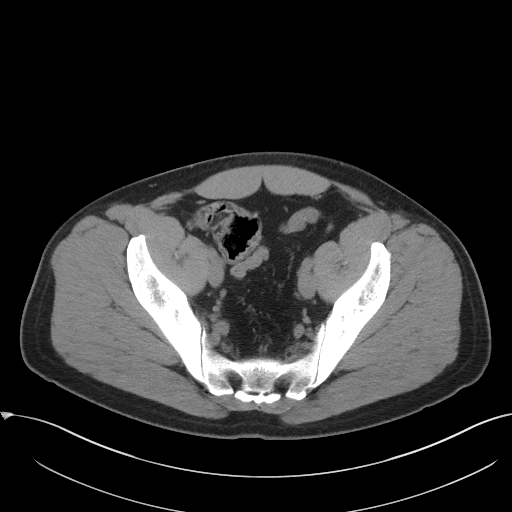
[im 36/100  soft-tissue]
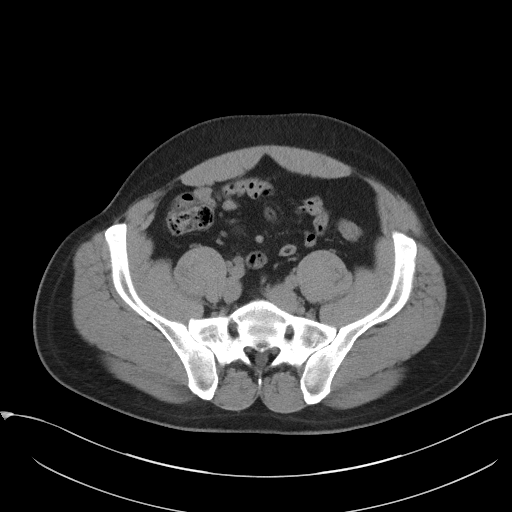
[im 44/100  soft-tissue]
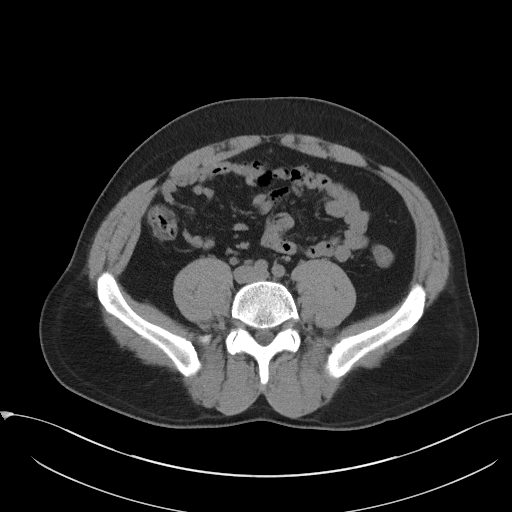
[im 52/100  soft-tissue]
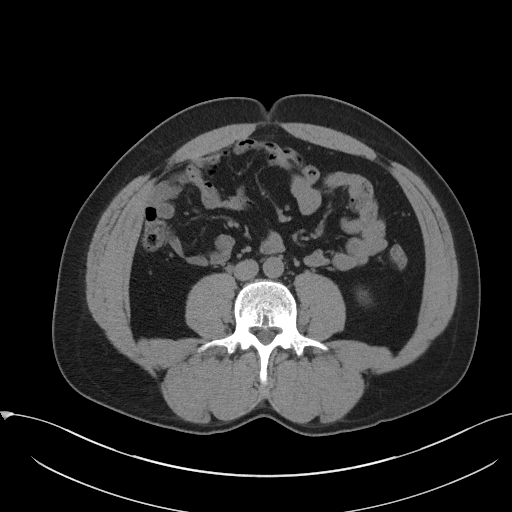
[im 56/100  soft-tissue]
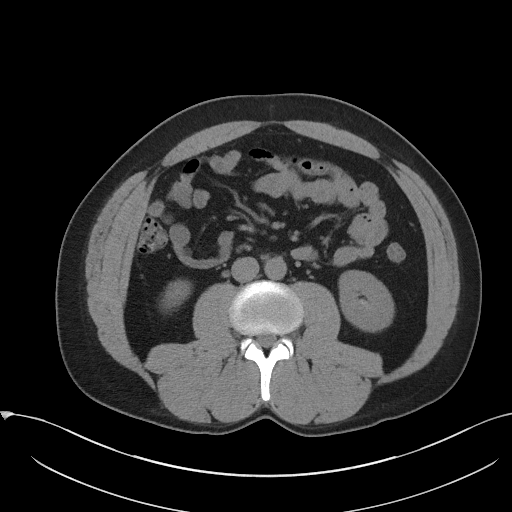
[im 64/100  soft-tissue]
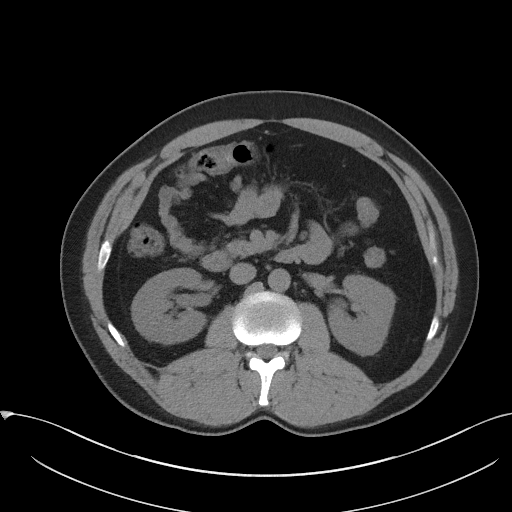
[im 64/100  bone]
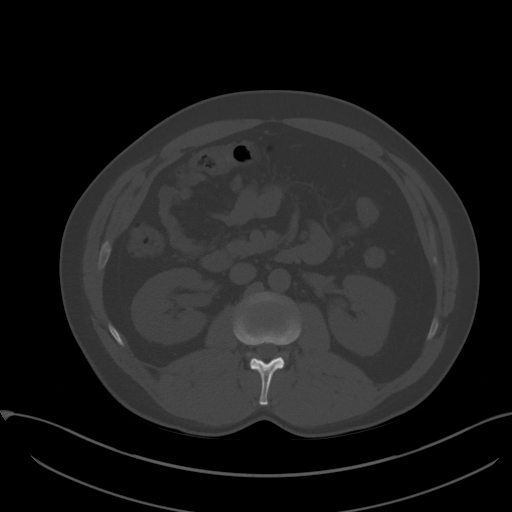
[im 72/100  soft-tissue]
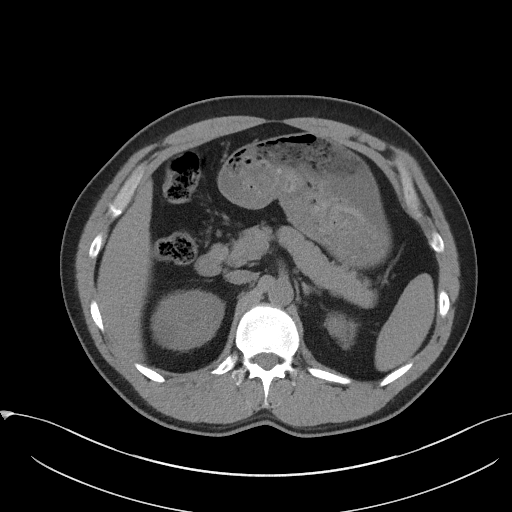
[im 80/100  soft-tissue]
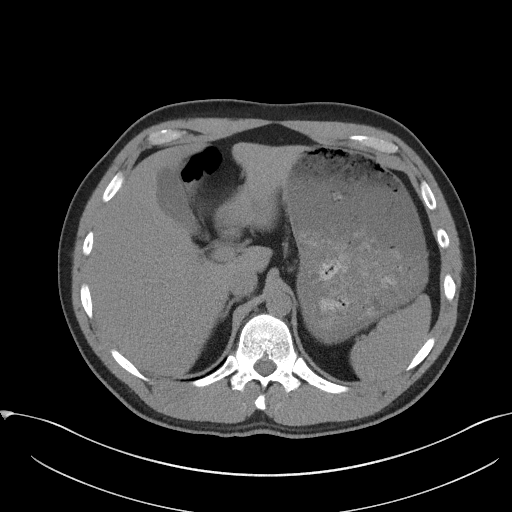
[im 88/100  soft-tissue]
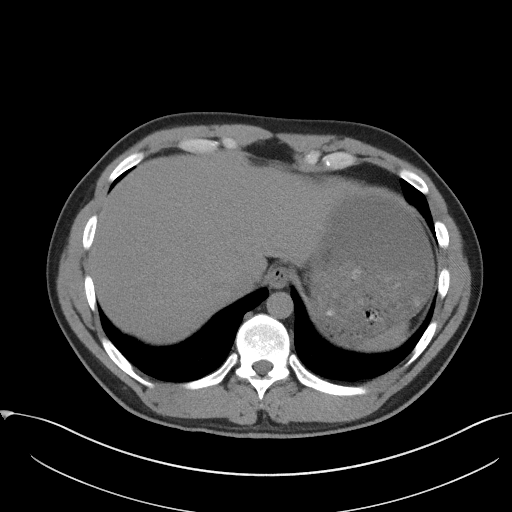
[im 96/100  soft-tissue]
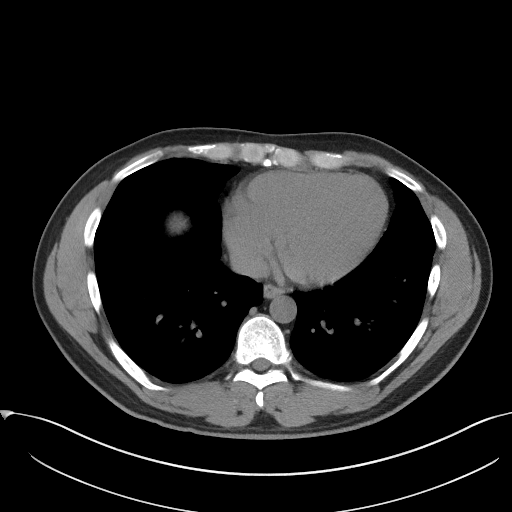

[Series 5: coronal · coronal · 0.77mm/px · 3 of 139 slices shown]
[im 47/139  soft-tissue]
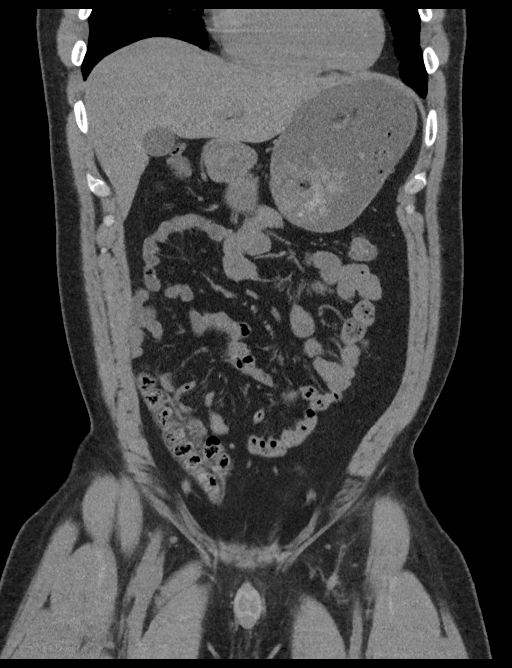
[im 62/139  soft-tissue]
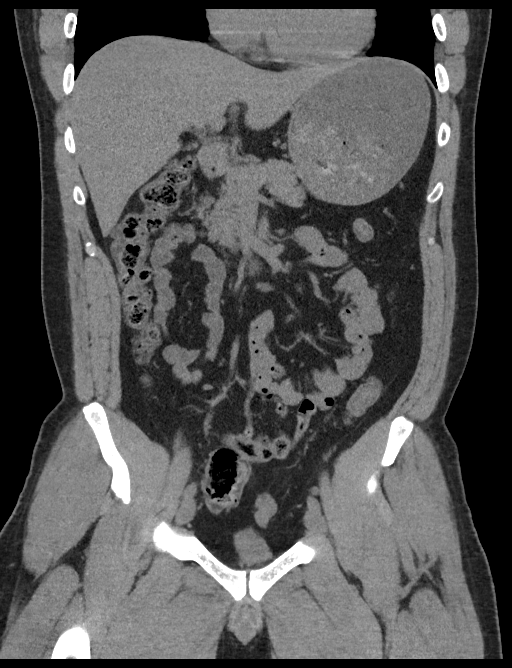
[im 77/139  soft-tissue]
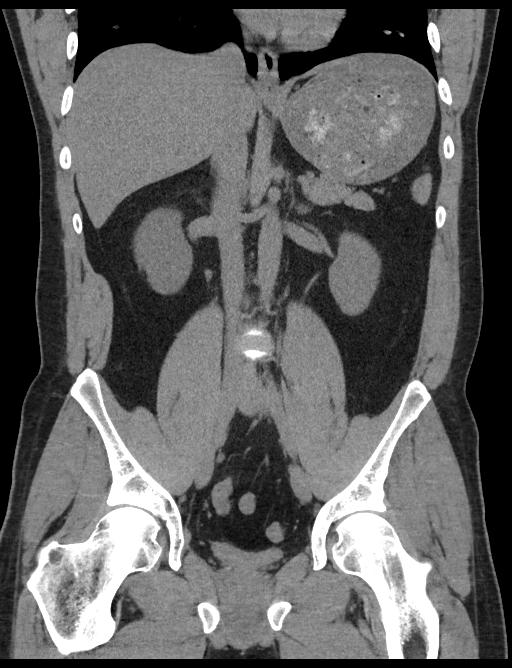

[16 of 46 positions shown; findings below may reference images not displayed]

FINDINGS: Lower chest: No acute abnormality.

Hepatobiliary: No focal liver abnormality is seen. No gallstones,
gallbladder wall thickening, or biliary dilatation.

Pancreas: Unremarkable. No pancreatic ductal dilatation or
surrounding inflammatory changes.

Spleen: Normal in size without focal abnormality.

Adrenals/Urinary Tract: Normal adrenal glands.

Left kidney and ureter demonstrate no acute obstruction,
hydronephrosis, or perinephric inflammatory process. Punctate tiny
left upper pole renal calculus noted, image 33. No other significant
left urinary tract abnormality. Bladder is collapsed.

Right kidney demonstrates very minor perinephric edema and mild
right hydronephrosis and hydroureter. This is secondary to a mildly
obstructing right distal ureteral 2 mm calculus in the pelvis, image
81 series 2. This is confirmed on the reconstructions. Right kidney
also has a hypodense probable cyst measuring 2.5 cm in the midpole
posteriorly. No additional right intrarenal calculi

Stomach/Bowel: Stomach is within normal limits. Appendix appears
normal. No evidence of bowel wall thickening, distention, or
inflammatory changes.

Vascular/Lymphatic: No significant vascular findings are present. No
enlarged abdominal or pelvic lymph nodes.

Reproductive: Seminal vesicles and prostate normal in size. No acute
finding by noncontrast CT.

Other: No abdominal wall hernia or abnormality. No abdominopelvic
ascites.

Musculoskeletal: No acute osseous finding.
IMPRESSION: Mildly obstructing 2 mm right distal ureteral calculus with mild
right hydroureteronephrosis.

Probable 2.5 cm right renal cyst

Punctate tiny nonobstructing left nephrolithiasis.

## 2017-12-14 ENCOUNTER — Other Ambulatory Visit: Payer: Self-pay | Admitting: Physician Assistant

## 2017-12-14 DIAGNOSIS — I10 Essential (primary) hypertension: Secondary | ICD-10-CM

## 2017-12-17 ENCOUNTER — Other Ambulatory Visit: Payer: Self-pay

## 2017-12-17 DIAGNOSIS — I10 Essential (primary) hypertension: Secondary | ICD-10-CM

## 2017-12-17 MED ORDER — AMLODIPINE BESYLATE 10 MG PO TABS: 10.0000 mg | ORAL_TABLET | Freq: Every day | ORAL | 3 refills | Status: DC

## 2017-12-17 NOTE — Telephone Encounter (Signed)
Looks like he sees Jason ContrasJenni now and she refilled amlodipine 90 tabs of 10 mg amlodipine with 3 refills in 09/2017. He has an appointment in January with jenni.

## 2017-12-18 NOTE — Telephone Encounter (Signed)
Patient was advised.  

## 2017-12-20 NOTE — Progress Notes (Signed)
Patient: Jason Stanton Male    DOB: Jun 08, 1985   32 y.o.   MRN: [id number] Visit Date: 12/26/2017  Today's Provider: Trey SailorsAdriana M Pollak, PA-C   Chief Complaint  Patient presents with  . Sleeping Problem   Subjective:    HPI Patient reports that he needs a sleep study done. He reports that he snores a lot. Reports that his wife send him to the couch to sleep because of his snoring.Reports that he has done mouth guards, patches that goes around his nose. Ex wife is suing custody for son and has began stress eating which has resulted in weight gain. He reports day time fatigue and high likelihood of dosing. He is being treated for HTN with amlodipine 10 mg QD.   Wt Readings from Last 3 Encounters:  12/21/17 241 lb (109.3 kg)  10/05/17 227 lb (103 kg)  10/02/17 226 lb 12.8 oz (102.9 kg)   BP Readings from Last 3 Encounters:  12/21/17 136/84  10/05/17 (!) 150/100  10/02/17 (!) 150/90       Allergies  Allergen Reactions  . Stadol [Butorphanol] Anaphylaxis     Current Outpatient Medications:  .  amLODipine (NORVASC) 10 MG tablet, Take 1 tablet (10 mg total) by mouth daily., Disp: 90 tablet, Rfl: 3 .  albuterol (PROVENTIL HFA;VENTOLIN HFA) 108 (90 Base) MCG/ACT inhaler, Inhale 2 puffs into the lungs every 6 (six) hours as needed for wheezing or shortness of breath. (Patient not taking: Reported on 12/21/2017), Disp: 1 Inhaler, Rfl: 0 .  [employer] NICOTINE 21 MG/24HR patch, PLACE 1 PATCH (21 MG TOTAL) ONTO THE SKIN DAILY. (Patient not taking: Reported on 12/21/2017), Disp: 28 patch, Rfl: 0 .  famotidine (PEPCID) 20 MG tablet, Take 20 mg by mouth daily., Disp: , Rfl:  .  naproxen sodium (ALEVE) 220 MG tablet, Take 220 mg by mouth., Disp: , Rfl:   Review of Systems  Negative except for HPI.   Social History   Tobacco Use  . Smoking status: Current Every Day Smoker    Packs/day: 0.50    Years: 2.00    Pack years: 1.00  . Smokeless tobacco: Never Used  Substance Use  Topics  . Alcohol use: Yes    Alcohol/week: 1.0 - 2.0 standard drinks    Types: 1 - 2 Cans of beer per week   Objective:   BP 136/84 (BP Location: Left Arm, Patient Position: Sitting, Cuff Size: Large)   Pulse 64   Temp 97.9 F (36.6 C) (Oral)   Resp 16   Wt 241 lb (109.3 kg)   BMI 32.69 kg/m  Vitals:   12/21/17 0841  BP: 136/84  Pulse: 64  Resp: 16  Temp: 97.9 F (36.6 C)  TempSrc: Oral  Weight: 241 lb (109.3 kg)   Results of the Epworth flowsheet 12/21/2017  Sitting and reading 1  Watching TV 2  Sitting, inactive in a public place (e.g. a theatre or a meeting) 0  As a passenger in a car for an hour without a break 0  Lying down to rest in the afternoon when circumstances permit 3  Sitting and talking to someone 0  Sitting quietly after a lunch without alcohol 0  In a car, while stopped for a few minutes in traffic 0  Total score 6     Physical Exam  Constitutional: He is oriented to person, place, and time. He appears well-developed and well-nourished.  Cardiovascular: Normal rate and regular rhythm.  Pulmonary/Chest:  Effort normal and breath sounds normal.  Neurological: He is alert and oriented to person, place, and time.  Skin: Skin is warm and dry.  Psychiatric: He has a normal mood and affect. His behavior is normal.        Assessment & Plan:     1. Other fatigue  - Home sleep test - Ambulatory referral to Sleep Studies  2. Suspected sleep apnea  - Ambulatory referral to Sleep Studies  Return if symptoms worsen or fail to improve.  The entirety of the information documented in the History of Present Illness, Review of Systems and Physical Exam were personally obtained by me. Portions of this information were initially documented by Hetty Ely, CMA and reviewed by me for thoroughness and accuracy.           Trey Sailors, PA-C  [facility]

## 2017-12-21 ENCOUNTER — Ambulatory Visit (INDEPENDENT_AMBULATORY_CARE_PROVIDER_SITE_OTHER): Payer: 59 | Admitting: Physician Assistant

## 2017-12-21 ENCOUNTER — Encounter: Payer: Self-pay | Admitting: Physician Assistant

## 2017-12-21 VITALS — BP 136/84 | HR 64 | Temp 97.9°F | Resp 16 | Wt 241.0 lb

## 2017-12-21 DIAGNOSIS — R29818 Other symptoms and signs involving the nervous system: Secondary | ICD-10-CM

## 2017-12-21 DIAGNOSIS — R5383 Other fatigue: Secondary | ICD-10-CM | POA: Diagnosis not present

## 2017-12-21 NOTE — Patient Instructions (Signed)

## 2017-12-28 ENCOUNTER — Telehealth: Payer: Self-pay | Admitting: Physician Assistant

## 2017-12-28 DIAGNOSIS — R29818 Other symptoms and signs involving the nervous system: Secondary | ICD-10-CM

## 2017-12-28 NOTE — Telephone Encounter (Signed)
Can we call patient? It looks like he wants a home sleep study, can somebody confirm this is right with him?

## 2017-12-28 NOTE — Addendum Note (Signed)
Addended by: Trey SailorsPOLLAK, ADRIANA M on: 12/28/2017 04:20 PM   Modules accepted: Orders

## 2017-12-28 NOTE — Telephone Encounter (Signed)
Orders for home sleep study placed. He will be contacted.

## 2017-12-28 NOTE — Telephone Encounter (Signed)
Per patient he talk to you about the home sleep study because is cheaper through his insurance.

## 2017-12-28 NOTE — Telephone Encounter (Signed)
-----   Message from Barrie LymeJennifer L Dodson sent at 12/28/2017 11:04 AM EST ----- Review incoming fax

## 2018-01-03 ENCOUNTER — Telehealth: Payer: Self-pay | Admitting: Physician Assistant

## 2018-01-03 NOTE — Telephone Encounter (Signed)
Order for home sleep study faxed to [facility] °

## 2018-01-24 DIAGNOSIS — R0602 Shortness of breath: Secondary | ICD-10-CM | POA: Diagnosis not present

## 2018-01-24 DIAGNOSIS — G4733 Obstructive sleep apnea (adult) (pediatric): Secondary | ICD-10-CM | POA: Diagnosis not present

## 2018-01-25 ENCOUNTER — Encounter: Payer: Self-pay | Admitting: Physician Assistant

## 2018-01-25 DIAGNOSIS — G4733 Obstructive sleep apnea (adult) (pediatric): Secondary | ICD-10-CM | POA: Diagnosis not present

## 2018-01-25 DIAGNOSIS — R0602 Shortness of breath: Secondary | ICD-10-CM | POA: Diagnosis not present

## 2018-02-06 NOTE — Progress Notes (Signed)
Patient: Jason Stanton, Male    DOB: [birth year], 33 y.o.   MRN: [id number] Visit Date: 02/08/2018  Today's Provider: Margaretann Loveless, PA-C   Chief Complaint  Patient presents with  . Annual Exam   Subjective:    Annual physical exam Jason Stanton is a 33 y.o. male who presents today for health maintenance and complete physical. He feels fairly well. He reports exercising yes/walking. He reports he is sleeping poorly. -----------------------------------------------------------------   Review of Systems  Constitutional: Negative.   HENT: Negative.   Eyes: Negative.   Respiratory: Positive for apnea.   Cardiovascular: Negative.   Gastrointestinal: Negative.   Endocrine: Negative.   Genitourinary: Negative.   Musculoskeletal: Positive for back pain.  Skin: Negative.   Allergic/Immunologic: Negative.   Neurological: Negative.   Hematological: Negative.   Psychiatric/Behavioral: Negative.     Social History      He  reports that he has been smoking. He has a 1.00 pack-year smoking history. He has never used smokeless tobacco. He reports current alcohol use of about 1.0 - 2.0 standard drinks of alcohol per week. He reports that he does not use drugs.       Social History   Socioeconomic History  . Marital status: Married    Spouse name: Not on file  . Number of children: Not on file  . Years of education: Not on file  . Highest education level: Not on file  Occupational History  . Not on file  Social Needs  . Financial resource strain: Not on file  . Food insecurity:    Worry: Not on file    Inability: Not on file  . Transportation needs:    Medical: Not on file    Non-medical: Not on file  Tobacco Use  . Smoking status: Current Every Day Smoker    Packs/day: 0.50    Years: 2.00    Pack years: 1.00  . Smokeless tobacco: Never Used  Substance and Sexual Activity  . Alcohol use: Yes    Alcohol/week: 1.0 - 2.0 standard drinks    Types:  1 - 2 Cans of beer per week  . Drug use: No  . Sexual activity: Not on file  Lifestyle  . Physical activity:    Days per week: Not on file    Minutes per session: Not on file  . Stress: Not on file  Relationships  . Social connections:    Talks on phone: Not on file    Gets together: Not on file    Attends religious service: Not on file    Active member of club or organization: Not on file    Attends meetings of clubs or organizations: Not on file    Relationship status: Not on file  Other Topics Concern  . Not on file  Social History Narrative  . Not on file    Past Medical History:  Diagnosis Date  . Heart murmur      There are no active problems to display for this patient.   History reviewed. No pertinent surgical history.  Family History        No family status information on file.        His family history is not on file.      Allergies  Allergen Reactions  . Stadol [Butorphanol] Anaphylaxis     Current Outpatient Medications:  .  amLODipine (NORVASC) 10 MG tablet, Take 1 tablet (10 mg total) by mouth  daily., Disp: 90 tablet, Rfl: 3 .  famotidine (PEPCID) 20 MG tablet, Take 20 mg by mouth daily., Disp: , Rfl:  .  naproxen sodium (ALEVE) 220 MG tablet, Take 220 mg by mouth., Disp: , Rfl:  .  albuterol (PROVENTIL HFA;VENTOLIN HFA) 108 (90 Base) MCG/ACT inhaler, Inhale 2 puffs into the lungs every 6 (six) hours as needed for wheezing or shortness of breath. (Patient not taking: Reported on 12/21/2017), Disp: 1 Inhaler, Rfl: 0 .  [employer] NICOTINE 21 MG/24HR patch, PLACE 1 PATCH (21 MG TOTAL) ONTO THE SKIN DAILY. (Patient not taking: Reported on 12/21/2017), Disp: 28 patch, Rfl: 0   Patient Care Team: Margaretann LovelessBurnette, Danzig Macgregor M, PA-C as PCP - General (Family Medicine)      Objective:   Vitals: BP 139/85 (BP Location: Left Arm, Cuff Size: Large)   Pulse 98   Temp 98.1 F (36.7 C) (Oral)   Resp 16   Ht 6' (1.829 m)   Wt 235 lb (106.6 kg)   SpO2 97%   BMI 31.87  kg/m    Vitals:   02/08/18 1508 02/08/18 1512  BP: (!) 145/92 139/85  Pulse: 92 98  Resp: 16   Temp: 98.1 F (36.7 C)   TempSrc: Oral   SpO2: 97%   Weight: 235 lb (106.6 kg)   Height: 6' (1.829 m)      Physical Exam Vitals signs reviewed.  Constitutional:      Appearance: He is well-developed. He is obese.  HENT:     Head: Normocephalic and atraumatic.     Right Ear: Tympanic membrane, ear canal and external ear normal.     Left Ear: Tympanic membrane, ear canal and external ear normal.     Nose: Nose normal.     Mouth/Throat:     Mouth: Mucous membranes are moist.  Eyes:     General:        Right eye: No discharge.     Conjunctiva/sclera: Conjunctivae normal.     Pupils: Pupils are equal, round, and reactive to light.  Neck:     Musculoskeletal: Normal range of motion and neck supple.     Thyroid: No thyromegaly.     Trachea: No tracheal deviation.  Cardiovascular:     Rate and Rhythm: Normal rate and regular rhythm.     Heart sounds: Normal heart sounds. No murmur.  Pulmonary:     Effort: Pulmonary effort is normal. No respiratory distress.     Breath sounds: Normal breath sounds. No wheezing or rales.  Chest:     Chest wall: No tenderness.  Abdominal:     General: There is no distension.     Palpations: Abdomen is soft. There is no mass.     Tenderness: There is no abdominal tenderness. There is no guarding or rebound.  Musculoskeletal: Normal range of motion.        General: No tenderness.  Lymphadenopathy:     Cervical: No cervical adenopathy.  Skin:    General: Skin is warm and dry.     Findings: No erythema or rash.  Neurological:     Mental Status: He is alert and oriented to person, place, and time.     Cranial Nerves: No cranial nerve deficit.     Motor: No abnormal muscle tone.     Coordination: Coordination normal.     Deep Tendon Reflexes: Reflexes are normal and symmetric. Reflexes normal.  Psychiatric:        Behavior: Behavior normal.  Thought Content: Thought content normal.        Judgment: Judgment normal.      Depression Screen PHQ 2/9 Scores 02/08/2018 08/03/2017  PHQ - 2 Score 0 0  PHQ- 9 Score 2 1      Assessment & Plan:     Routine Health Maintenance and Physical Exam  Exercise Activities and Dietary recommendations Goals   None     Immunization History  Administered Date(s) Administered  . Influenza Inj Mdck Quad Pf 11/04/2016    Health Maintenance  Topic Date Due  . TETANUS/TDAP  10/05/2005  . INFLUENZA VACCINE  08/16/2017  . HIV Screening  02/13/2018 (Originally 10/05/2001)     Discussed health benefits of physical activity, and encouraged him to engage in regular exercise appropriate for his age and condition.    1. Annual physical exam Normal physical exam today. Will check labs as below and f/u pending lab results. If labs are stable and WNL he will not need to have these rechecked for one year at his next annual physical exam. He is to call the office in the meantime if he has any acute issue, questions or concerns. - CBC w/Diff/Platelet - Comprehensive Metabolic Panel (CMET) - TSH - Lipid Profile - HgB A1c  2. Essential hypertension Stable on amlodipine 10mg . Will check labs as below and f/u pending results. - CBC w/Diff/Platelet - Comprehensive Metabolic Panel (CMET) - TSH - Lipid Profile - HgB A1c  3. Class 1 obesity due to excess calories with serious comorbidity and body mass index (BMI) of 31.0 to 31.9 in adult Counseled patient on healthy lifestyle modifications including dieting and exercise.  - CBC w/Diff/Platelet - Comprehensive Metabolic Panel (CMET) - TSH - Lipid Profile - HgB A1c  4. Screening for HIV without presence of risk factors Will check labs as below and f/u pending results. - HIV antibody (with reflex)  --------------------------------------------------------------------    Margaretann LovelessJennifer M Amador Braddy, PA-C  [facility]

## 2018-02-07 DIAGNOSIS — G4733 Obstructive sleep apnea (adult) (pediatric): Secondary | ICD-10-CM | POA: Diagnosis not present

## 2018-02-08 ENCOUNTER — Ambulatory Visit (INDEPENDENT_AMBULATORY_CARE_PROVIDER_SITE_OTHER): Payer: 59 | Admitting: Physician Assistant

## 2018-02-08 ENCOUNTER — Encounter: Payer: Self-pay | Admitting: Physician Assistant

## 2018-02-08 VITALS — BP 139/85 | HR 98 | Temp 98.1°F | Resp 16 | Ht 72.0 in | Wt 235.0 lb

## 2018-02-08 DIAGNOSIS — E6609 Other obesity due to excess calories: Secondary | ICD-10-CM | POA: Diagnosis not present

## 2018-02-08 DIAGNOSIS — Z0000 Encounter for general adult medical examination without abnormal findings: Secondary | ICD-10-CM

## 2018-02-08 DIAGNOSIS — Z6831 Body mass index (BMI) 31.0-31.9, adult: Secondary | ICD-10-CM

## 2018-02-08 DIAGNOSIS — I10 Essential (primary) hypertension: Secondary | ICD-10-CM | POA: Insufficient documentation

## 2018-02-08 DIAGNOSIS — Z114 Encounter for screening for human immunodeficiency virus [HIV]: Secondary | ICD-10-CM

## 2018-02-08 NOTE — Patient Instructions (Signed)

## 2018-03-10 DIAGNOSIS — G4733 Obstructive sleep apnea (adult) (pediatric): Secondary | ICD-10-CM | POA: Diagnosis not present

## 2018-04-08 DIAGNOSIS — G4733 Obstructive sleep apnea (adult) (pediatric): Secondary | ICD-10-CM | POA: Diagnosis not present

## 2018-04-12 DIAGNOSIS — M5442 Lumbago with sciatica, left side: Secondary | ICD-10-CM | POA: Diagnosis not present

## 2018-05-09 DIAGNOSIS — G4733 Obstructive sleep apnea (adult) (pediatric): Secondary | ICD-10-CM | POA: Diagnosis not present

## 2018-06-08 DIAGNOSIS — G4733 Obstructive sleep apnea (adult) (pediatric): Secondary | ICD-10-CM | POA: Diagnosis not present

## 2018-09-26 ENCOUNTER — Encounter: Payer: Self-pay | Admitting: Physician Assistant

## 2018-09-26 ENCOUNTER — Ambulatory Visit (INDEPENDENT_AMBULATORY_CARE_PROVIDER_SITE_OTHER): Payer: 59 | Admitting: Physician Assistant

## 2018-09-26 ENCOUNTER — Other Ambulatory Visit: Payer: Self-pay

## 2018-09-26 DIAGNOSIS — J029 Acute pharyngitis, unspecified: Secondary | ICD-10-CM | POA: Diagnosis not present

## 2018-09-26 DIAGNOSIS — Z20822 Contact with and (suspected) exposure to covid-19: Secondary | ICD-10-CM

## 2018-09-26 DIAGNOSIS — Z8709 Personal history of other diseases of the respiratory system: Secondary | ICD-10-CM

## 2018-09-26 MED ORDER — AMOXICILLIN-POT CLAVULANATE 875-125 MG PO TABS: 1.0000 | ORAL_TABLET | Freq: Two times a day (BID) | ORAL | 0 refills | Status: DC

## 2018-09-26 MED ORDER — AMOXICILLIN-POT CLAVULANATE 875-125 MG PO TABS: 1.0000 | ORAL_TABLET | Freq: Two times a day (BID) | ORAL | 0 refills | Status: AC

## 2018-09-26 NOTE — Telephone Encounter (Signed)
You saw him today for sore throat

## 2018-09-26 NOTE — Progress Notes (Signed)
Subjective:    Patient ID: Jason Stanton, male    DOB: May 06, 1985, 33 y.o.   MRN: [id number]  Jason Stanton is a 33 y.o. male presenting on 09/26/2018 for Cough and Emesis  Virtual Visit via Telephone Note  I connected with Jason Stanton on 09/26/18 at  3:40 PM EDT by telephone and verified that I am speaking with the correct person using two identifiers.   I discussed the limitations, risks, security and privacy concerns of performing an evaluation and management service by telephone and the availability of in person appointments. I also discussed with the patient that there may be a patient responsible charge related to this service. The patient expressed understanding and agreed to proceed.  Patient location: home [facility]  Persons involved in the visit: patient, provider   HPI   Patient is presenting today for cough and vomiting. Reports he returned from vacation on Tuesday[place] Brought camper and went camping, did not go to restaurants. Reports at work, throat started hurting and chest is heavy. Reports he had strep 8 times last year and has been informed that wife is a carrier for strep. He reports fatigue and had to stay in bed all day. Returned to work today and throat was very sore. Reports this feels similar to strep.   Social History   Tobacco Use  . Smoking status: Current Every Day Smoker    Packs/day: 0.50    Years: 2.00    Pack years: 1.00  . Smokeless tobacco: Never Used  Substance Use Topics  . Alcohol use: Yes    Alcohol/week: 1.0 - 2.0 standard drinks    Types: 1 - 2 Cans of beer per week  . Drug use: No    Review of Systems Per HPI unless specifically indicated above     Objective:    There were no vitals taken for this visit.  Wt Readings from Last 3 Encounters:  02/08/18 235 lb (106.6 kg)  12/21/17 241 lb (109.3 kg)  10/05/17 227 lb (103 kg)     Physical Exam Results for orders placed or performed during the hospital encounter of 08/16/16  CBC  Result Value Ref Range   WBC 15.4 (H) 3.8 - 10.6 K/uL   RBC 4.88 4.40 - 5.90 MIL/uL   Hemoglobin 15.9 13.0 - 18.0 g/dL   HCT 40.945.1 81.140.0 - 91.452.0 %   MCV 92.4 80.0 - 100.0 fL   MCH 32.5 26.0 - 34.0 pg   MCHC 35.2 32.0 - 36.0 g/dL   RDW 78.212.8 95.611.5 - 21.314.5 %   Platelets 241 150 - 440 K/uL  Comprehensive metabolic panel  Result Value Ref Range   Sodium 142 135 - 145 mmol/L   Potassium 3.8 3.5 - 5.1 mmol/L   Chloride 105 101 - 111 mmol/L   CO2 26 22 - 32 mmol/L   Glucose, Bld 111 (H) 65 - 99 mg/dL   BUN 21 (H) 6 - 20 mg/dL   Creatinine, Ser 0.861.56 (H) 0.61 - 1.24 mg/dL   Calcium 57.810.1 8.9 - 46.910.3 mg/dL   Total Protein 8.3 (H) 6.5 - 8.1 g/dL   Albumin 5.0 3.5 - 5.0 g/dL   AST 43 (H) 15 - 41 U/L   ALT 43 17 - 63 U/L   Alkaline Phosphatase 81 38 - 126 U/L   Total Bilirubin 0.8 0.3 - 1.2 mg/dL   GFR calc non Af Amer 59 (L) >60  mL/min   GFR calc Af Amer >60 >60 mL/min   Anion gap 11 5 - 15  Urinalysis, Complete w Microscopic  Result Value Ref Range   Color, Urine YELLOW (A) YELLOW   APPearance HAZY (A) CLEAR   Specific Gravity, Urine 1.033 (H) 1.005 - 1.030   pH 5.0 5.0 - 8.0   Glucose, UA NEGATIVE NEGATIVE mg/dL   Hgb urine dipstick LARGE (A) NEGATIVE   Bilirubin Urine NEGATIVE NEGATIVE   Ketones, ur 5 (A) NEGATIVE mg/dL   Protein, ur 30 (A) NEGATIVE mg/dL   Nitrite NEGATIVE NEGATIVE   Leukocytes, UA NEGATIVE NEGATIVE   RBC / HPF TOO NUMEROUS TO COUNT 0 - 5 RBC/hpf   WBC, UA 0-5 0 - 5 WBC/hpf   Bacteria, UA NONE SEEN NONE SEEN   Squamous Epithelial / LPF NONE SEEN NONE SEEN   Mucus PRESENT       Assessment & Plan:  1. Sore throat  Patient got COVID tested this morning at drive-through testing site. He really feels this is similar to prior strep episodes and has a history of getting this frequently. Will treat as below, work note provided to remain out of work.   -  amoxicillin-clavulanate (AUGMENTIN) 875-125 MG tablet; Take 1 tablet by mouth 2 (two) times daily for 10 days.  Dispense: 20 tablet; Refill: 0  2. History of strep pharyngitis    Follow up plan: Return if symptoms worsen or fail to improve.  Carles Collet, PA-C [facility] 09/26/2018, 3:50 PM

## 2018-09-27 LAB — NOVEL CORONAVIRUS, NAA: SARS-CoV-2, NAA: NOT DETECTED

## 2018-10-09 ENCOUNTER — Telehealth: Payer: Self-pay

## 2018-10-09 NOTE — Telephone Encounter (Signed)
Yes agree with plan

## 2018-10-09 NOTE — Telephone Encounter (Signed)
Patient calling that yesterday when he took a deep breath his chest would hurt, reports the pain is only with breathing. He also c/o pain in his upper back. Reports that his mother is nurse and took his BP yesterday and it was 149/113 reports that today he feels some pressure/discomfort in his chest with breathing. Reports that his mother was just dx with cancer and that he is really close to her. Patient is at work this morning didn't check his BP today. Requested an appt. For Friday. Appt. Given. Did advised patient to go to the ED if pain worsen.

## 2018-10-10 NOTE — Progress Notes (Signed)
Patient: Jason Stanton Male    DOB: 08-15-1985   33 y.o.   MRN: [id number] Visit Date: 10/11/2018  Today's Provider: Margaretann Loveless, PA-C   Chief Complaint  Patient presents with  . Hypertension   Subjective:     HPI   Essential hypertension From 02/08/2018-Stable on amlodipine 10mg . Labs ordered, but zero were done.  Patient states he has been checking his bp at home. Bp was 149/113 two days ago and 138/111 yesterday. Patient states he has had been having chest pressure for 1 week. No shortness of breath. He does have history of rate variant arrhythmia (reports when he was a kid he had to wear a device that showed his HR would be normal, then drop, then go right back to normal). He does report increased stress recently with his mother being diagnosed with cancer.   Allergies  Allergen Reactions  . Stadol [Butorphanol] Anaphylaxis     Current Outpatient Medications:  .  amLODipine (NORVASC) 10 MG tablet, Take 1 tablet (10 mg total) by mouth daily., Disp: 90 tablet, Rfl: 3  Review of Systems  Constitutional: Negative for appetite change, chills and fever.  HENT: Negative for congestion.   Respiratory: Positive for chest tightness. Negative for shortness of breath and wheezing.   Cardiovascular: Negative for chest pain, palpitations and leg swelling.  Gastrointestinal: Negative for abdominal pain, nausea and vomiting.  Psychiatric/Behavioral: The patient is nervous/anxious.     Social History   Tobacco Use  . Smoking status: Current Every Day Smoker    Packs/day: 0.50    Years: 2.00    Pack years: 1.00  . Smokeless tobacco: Never Used  Substance Use Topics  . Alcohol use: Yes    Alcohol/week: 1.0 - 2.0 standard drinks    Types: 1 - 2 Cans of beer per week      Objective:   BP 139/89 (BP Location: Left Arm, Patient Position: Sitting, Cuff Size: Large)   Pulse 82   Temp (!) 97.3 F (36.3 C) (Other (Comment))   Resp 16   Ht 6' (1.829 m)   Wt  236 lb (107 kg)   SpO2 98%   BMI 32.01 kg/m  Vitals:   10/11/18 0829  BP: 139/89  Pulse: 82  Resp: 16  Temp: (!) 97.3 F (36.3 C)  TempSrc: Other (Comment)  SpO2: 98%  Weight: 236 lb (107 kg)  Height: 6' (1.829 m)  Body mass index is 32.01 kg/m.   Physical Exam Vitals signs reviewed.  Constitutional:      General: He is not in acute distress.    Appearance: Normal appearance. He is well-developed. He is obese. He is not ill-appearing or diaphoretic.  HENT:     Head: Normocephalic and atraumatic.  Neck:     Musculoskeletal: Normal range of motion and neck supple.     Thyroid: No thyromegaly.     Vascular: No JVD.     Trachea: No tracheal deviation.  Cardiovascular:     Rate and Rhythm: Normal rate and regular rhythm.     Pulses: Normal pulses.     Heart sounds: Normal heart sounds. No murmur. No friction rub. No gallop.   Pulmonary:     Effort: Pulmonary effort is normal. No respiratory distress.     Breath sounds: Normal breath sounds. No wheezing or rales.  Lymphadenopathy:     Cervical: No cervical adenopathy.  Neurological:     Mental Status: He is alert.  No results found for any visits on 10/11/18.     Assessment & Plan    1. Chest pressure EKG today shows sinus bradycardia rate of 59 with rate variation, no ST segment changes personally reviewed by me. Suspect symptoms are multifactorial. I do feel his BP is not fully controlled, which could contribute some to his fatigue and chest pressure. I do feel there is also an anxiety/stress component as well. He is not ready to start any medications for anxiety at this time, which is agreeable. I will change his BP medication as below, check labs, and then f/u in 6 weeks.  - EKG 12-Lead  2. Essential hypertension Stop amlodipine 10mg . Start amlodipine-benazepril 5-10mg . Recheck in 6 weeks.  - amLODipine-benazepril (LOTREL) 5-10 MG capsule; Take 1 capsule by mouth daily.  Dispense: 90 capsule; Refill: 1  3.  Class 1 obesity due to excess calories with serious comorbidity and body mass index (BMI) of 32.0 to 32.9 in adult Counseled patient on healthy lifestyle modifications including dieting and exercise.      Mar Daring, PA-C  [facility]

## 2018-10-11 ENCOUNTER — Other Ambulatory Visit: Payer: Self-pay

## 2018-10-11 ENCOUNTER — Encounter: Payer: Self-pay | Admitting: Physician Assistant

## 2018-10-11 ENCOUNTER — Ambulatory Visit (INDEPENDENT_AMBULATORY_CARE_PROVIDER_SITE_OTHER): Payer: 59 | Admitting: Physician Assistant

## 2018-10-11 VITALS — BP 139/89 | HR 82 | Temp 97.3°F | Resp 16 | Ht 72.0 in | Wt 236.0 lb

## 2018-10-11 DIAGNOSIS — I10 Essential (primary) hypertension: Secondary | ICD-10-CM

## 2018-10-11 DIAGNOSIS — R0789 Other chest pain: Secondary | ICD-10-CM

## 2018-10-11 DIAGNOSIS — E6609 Other obesity due to excess calories: Secondary | ICD-10-CM | POA: Diagnosis not present

## 2018-10-11 DIAGNOSIS — Z6832 Body mass index (BMI) 32.0-32.9, adult: Secondary | ICD-10-CM

## 2018-10-11 MED ORDER — AMLODIPINE BESY-BENAZEPRIL HCL 5-10 MG PO CAPS: 1.0000 | ORAL_CAPSULE | Freq: Every day | ORAL | 1 refills | Status: DC

## 2018-10-11 NOTE — Patient Instructions (Signed)
Amlodipine; Benazepril capsules What is this medicine? AMLODIPINE; BENAZEPRIL (am LOE di peen; ben AY ze pril) is a combination of two drugs. It is used to treat high blood pressure. This medicine may be used for other purposes; ask your health care provider or pharmacist if you have questions. COMMON BRAND NAME(S): Lotrel What should I tell my health care provider before I take this medicine? They need to know if you have any of these conditions:  bone marrow disease  heart or blood vessel disease  if you are on a special diet, such as a low-salt diet  immune system disease or disorder, like lupus  kidney or liver disease  previous swelling of the tongue, face, or lips with difficulty breathing, difficulty swallowing, hoarseness, or tightening of the throat  an unusual or allergic reaction to amlodipine, benazepril, other medicines, insect venom, foods, dyes, or preservatives  pregnant or trying to get pregnant  breast-feeding How should I use this medicine? Take this medicine by mouth with a glass of water. Follow the directions on the prescription label. You can take the capsules with or without food. Take your doses at regular intervals. Do not take your medicine more often then directed. Do not stop taking except on the advice of your doctor or health care professional. Talk to your pediatrician regarding the use of this medicine in children. Special care may be needed. Overdosage: If you think you have taken too much of this medicine contact a poison control center or emergency room at once. NOTE: This medicine is only for you. Do not share this medicine with others. What if I miss a dose? If you miss a dose, take it as soon as you can. If it is almost time for your next dose, take only that dose. Do not take double or extra doses. What may interact with this medicine? Do not take this medication with any of the following medications:  sacubitril; valsartan This medicine may  also interact with the following:  diuretics, especially amiloride, triamterene, or spironolactone  everolimus  lithium  medicines for blood pressure  potassium salts or potassium supplements  sirolimus  temsirolimus This list may not describe all possible interactions. Give your health care provider a list of all the medicines, herbs, non-prescription drugs, or dietary supplements you use. Also tell them if you smoke, drink alcohol, or use illegal drugs. Some items may interact with your medicine. What should I watch for while using this medicine? Visit your doctor or health care professional for regular checks on your progress. Check your blood pressure as directed. Ask your doctor or health care professional what your blood pressure should be and when you should contact him or her. Call your doctor or health care professional if you notice an irregular or fast heart beat. You may get drowsy or dizzy. Do not drive, use machinery, or do anything that needs mental alertness until you know how this drug affects you. Do not stand or sit up quickly, especially if you are an older patient. This reduces the risk of dizzy or fainting spells. Alcohol can make you more drowsy and dizzy. Avoid alcoholic drinks. If you are going to have surgery, tell your doctor or health care professional that you are taking this medicine. Women should inform their doctor if they wish to become pregnant or think they might be pregnant. There is a potential for serious side effects to an unborn child. Talk to your health care professional or pharmacist for more information. Check  with your doctor or health care professional if you get an attack of severe diarrhea, nausea and vomiting, or if you sweat a lot. The loss of body fluid can make it dangerous to take this medicine. A few patients have had strong allergic reactions during desensitization treatment with hymenoptera venom and during some kinds of dialysis. Talk to  your doctor if you are going to have either of these procedures. Avoid salt substitutes unless you are told otherwise by your doctor or health care professional. Do not treat yourself for coughs, colds, or pain while you are taking this medicine without asking your doctor or health care professional for advice. Some ingredients may increase your blood pressure. What side effects may I notice from receiving this medicine? Side effects that you should report to your doctor or health care professional as soon as possible:  allergic reactions like skin rash, itching or hives, swelling of the face, lips, or tongue  breathing problems  chest pain  dry cough  fast, irregular heartbeat  redness, blistering, peeling or loosening of the skin, including inside the mouth  stomach pain  swelling of your ankles, legs  trouble passing urine or change in the amount of urine Side effects that usually do not require medical attention (report to your doctor or health care professional if they continue or are bothersome):  drowsiness or dizziness  facial flushing  headache This list may not describe all possible side effects. Call your doctor for medical advice about side effects. You may report side effects to FDA at 1-800-FDA-1088. Where should I keep my medicine? Keep out of the reach of children. Store at room temperature between 15 and 30 degrees C (59 and 86 degrees F). Protect from moisture. Keep container tightly closed. Throw away any unused medicine after the expiration date. NOTE: This sheet is a summary. It may not cover all possible information. If you have questions about this medicine, talk to your doctor, pharmacist, or health care provider.  2020 Elsevier/Gold Standard (2015-02-26 08:57:26)

## 2018-10-12 LAB — HEMOGLOBIN A1C
Est. average glucose Bld gHb Est-mCnc: 97 mg/dL
Hgb A1c MFr Bld: 5 % (ref 4.8–5.6)

## 2018-10-12 LAB — COMPREHENSIVE METABOLIC PANEL
ALT: 101 IU/L — ABNORMAL HIGH (ref 0–44)
AST: 78 IU/L — ABNORMAL HIGH (ref 0–40)
Albumin/Globulin Ratio: 1.8 (ref 1.2–2.2)
Albumin: 4.9 g/dL (ref 4.0–5.0)
Alkaline Phosphatase: 71 IU/L (ref 39–117)
BUN/Creatinine Ratio: 10 (ref 9–20)
BUN: 11 mg/dL (ref 6–20)
Bilirubin Total: 0.5 mg/dL (ref 0.0–1.2)
CO2: 22 mmol/L (ref 20–29)
Calcium: 9.9 mg/dL (ref 8.7–10.2)
Chloride: 101 mmol/L (ref 96–106)
Creatinine, Ser: 1.14 mg/dL (ref 0.76–1.27)
GFR calc Af Amer: 98 mL/min/{1.73_m2} (ref >59)
GFR calc non Af Amer: 85 mL/min/{1.73_m2} (ref >59)
Globulin, Total: 2.8 g/dL (ref 1.5–4.5)
Glucose: 96 mg/dL (ref 65–99)
Potassium: 4.5 mmol/L (ref 3.5–5.2)
Sodium: 139 mmol/L (ref 134–144)
Total Protein: 7.7 g/dL (ref 6.0–8.5)

## 2018-10-12 LAB — CBC WITH DIFFERENTIAL/PLATELET
Basophils Absolute: 0.1 10*3/uL (ref 0.0–0.2)
Basos: 2 %
EOS (ABSOLUTE): 0.2 10*3/uL (ref 0.0–0.4)
Eos: 3 %
Hematocrit: 45.8 % (ref 37.5–51.0)
Hemoglobin: 16.2 g/dL (ref 13.0–17.7)
Immature Grans (Abs): 0 10*3/uL (ref 0.0–0.1)
Immature Granulocytes: 0 %
Lymphocytes Absolute: 2.2 10*3/uL (ref 0.7–3.1)
Lymphs: 35 %
MCH: 31.5 pg (ref 26.6–33.0)
MCHC: 35.4 g/dL (ref 31.5–35.7)
MCV: 89 fL (ref 79–97)
Monocytes Absolute: 0.7 10*3/uL (ref 0.1–0.9)
Monocytes: 11 %
Neutrophils Absolute: 3.1 10*3/uL (ref 1.4–7.0)
Neutrophils: 49 %
Platelets: 286 10*3/uL (ref 150–450)
RBC: 5.14 x10E6/uL (ref 4.14–5.80)
RDW: 11.7 % (ref 11.6–15.4)
WBC: 6.2 10*3/uL (ref 3.4–10.8)

## 2018-10-12 LAB — LIPID PANEL
Chol/HDL Ratio: 2.7 ratio (ref 0.0–5.0)
Cholesterol, Total: 160 mg/dL (ref 100–199)
HDL: 59 mg/dL (ref >39)
LDL Chol Calc (NIH): 82 mg/dL (ref 0–99)
Triglycerides: 107 mg/dL (ref 0–149)
VLDL Cholesterol Cal: 19 mg/dL (ref 5–40)

## 2018-10-12 LAB — TSH: TSH: 2.07 u[IU]/mL (ref 0.450–4.500)

## 2018-10-16 ENCOUNTER — Telehealth: Payer: Self-pay

## 2018-10-16 NOTE — Telephone Encounter (Signed)
-----   Message from Mar Daring, [place] sent at 10/16/2018  1:38 PM EDT ----- Blood count is stable and normal. Kidney function is normal. Liver enzymes are elevated. This is most often associated with lifestyle habits. Limit fatty foods, alcohol and tylenol (acetaminophen) based products. Will recheck levels in 6 weeks. Sodium, potassium, and calcium are normal. Thyroid is normal. Cholesterol is normal. Sugar is normal.

## 2018-10-16 NOTE — Telephone Encounter (Signed)
Pt advised.   Thanks,   -Theora Vankirk  

## 2018-10-16 NOTE — Telephone Encounter (Signed)
-----   Message from Jennifer M Burnette, PA-C sent at 10/16/2018  1:38 PM EDT ----- Blood count is stable and normal. Kidney function is normal. Liver enzymes are elevated. This is most often associated with lifestyle habits. Limit fatty foods, alcohol and tylenol (acetaminophen) based products. Will recheck levels in 6 weeks. Sodium, potassium, and calcium are normal. Thyroid is normal. Cholesterol is normal. Sugar is normal. 

## 2018-10-16 NOTE — Telephone Encounter (Signed)
Pt advised.   Thanks,   -Anjelo Pullman  

## 2018-11-08 ENCOUNTER — Ambulatory Visit: Payer: Self-pay | Admitting: Physician Assistant

## 2018-12-02 ENCOUNTER — Encounter: Payer: Self-pay | Admitting: Physician Assistant

## 2018-12-02 ENCOUNTER — Other Ambulatory Visit: Payer: Self-pay

## 2018-12-02 ENCOUNTER — Ambulatory Visit (INDEPENDENT_AMBULATORY_CARE_PROVIDER_SITE_OTHER): Payer: 59 | Admitting: Physician Assistant

## 2018-12-02 VITALS — BP 130/79 | HR 90 | Temp 97.3°F | Resp 16

## 2018-12-02 DIAGNOSIS — F4323 Adjustment disorder with mixed anxiety and depressed mood: Secondary | ICD-10-CM

## 2018-12-02 DIAGNOSIS — I10 Essential (primary) hypertension: Secondary | ICD-10-CM | POA: Diagnosis not present

## 2018-12-02 MED ORDER — SERTRALINE HCL 50 MG PO TABS: 50.0000 mg | ORAL_TABLET | Freq: Every day | ORAL | 0 refills | Status: DC

## 2018-12-02 NOTE — Patient Instructions (Signed)
Sertraline tablets What is this medicine? SERTRALINE (SER tra leen) is used to treat depression. It may also be used to treat obsessive compulsive disorder, panic disorder, post-trauma stress, premenstrual dysphoric disorder (PMDD) or social anxiety. This medicine may be used for other purposes; ask your health care provider or pharmacist if you have questions. COMMON BRAND NAME(S): Zoloft What should I tell my health care provider before I take this medicine? They need to know if you have any of these conditions:  bleeding disorders  bipolar disorder or a family history of bipolar disorder  glaucoma  heart disease  high blood pressure  history of irregular heartbeat  history of low levels of calcium, magnesium, or potassium in the blood  if you often drink alcohol  liver disease  receiving electroconvulsive therapy  seizures  suicidal thoughts, plans, or attempt; a previous suicide attempt by you or a family member  take medicines that treat or prevent blood clots  thyroid disease  an unusual or allergic reaction to sertraline, other medicines, foods, dyes, or preservatives  pregnant or trying to get pregnant  breast-feeding How should I use this medicine? Take this medicine by mouth with a glass of water. Follow the directions on the prescription label. You can take it with or without food. Take your medicine at regular intervals. Do not take your medicine more often than directed. Do not stop taking this medicine suddenly except upon the advice of your doctor. Stopping this medicine too quickly may cause serious side effects or your condition may worsen. A special MedGuide will be given to you by the pharmacist with each prescription and refill. Be sure to read this information carefully each time. Talk to your pediatrician regarding the use of this medicine in children. While this drug may be prescribed for children as young as 7 years for selected conditions,  precautions do apply. Overdosage: If you think you have taken too much of this medicine contact a poison control center or emergency room at once. NOTE: This medicine is only for you. Do not share this medicine with others. What if I miss a dose? If you miss a dose, take it as soon as you can. If it is almost time for your next dose, take only that dose. Do not take double or extra doses. What may interact with this medicine? Do not take this medicine with any of the following medications:  cisapride  dronedarone  linezolid  MAOIs like Carbex, Eldepryl, Marplan, Nardil, and Parnate  methylene blue (injected into a vein)  pimozide  thioridazine This medicine may also interact with the following medications:  alcohol  amphetamines  aspirin and aspirin-like medicines  certain medicines for depression, anxiety, or psychotic disturbances  certain medicines for fungal infections like ketoconazole, fluconazole, posaconazole, and itraconazole  certain medicines for irregular heart beat like flecainide, quinidine, propafenone  certain medicines for migraine headaches like almotriptan, eletriptan, frovatriptan, naratriptan, rizatriptan, sumatriptan, zolmitriptan  certain medicines for sleep  certain medicines for seizures like carbamazepine, valproic acid, phenytoin  certain medicines that treat or prevent blood clots like warfarin, enoxaparin, dalteparin  cimetidine  digoxin  diuretics  fentanyl  isoniazid  lithium  NSAIDs, medicines for pain and inflammation, like ibuprofen or naproxen  other medicines that prolong the QT interval (cause an abnormal heart rhythm) like dofetilide  rasagiline  safinamide  supplements like St. John's wort, kava kava, valerian  tolbutamide  tramadol  tryptophan This list may not describe all possible interactions. Give your health care provider   a list of all the medicines, herbs, non-prescription drugs, or dietary supplements  you use. Also tell them if you smoke, drink alcohol, or use illegal drugs. Some items may interact with your medicine. What should I watch for while using this medicine? Tell your doctor if your symptoms do not get better or if they get worse. Visit your doctor or health care professional for regular checks on your progress. Because it may take several weeks to see the full effects of this medicine, it is important to continue your treatment as prescribed by your doctor. Patients and their families should watch out for new or worsening thoughts of suicide or depression. Also watch out for sudden changes in feelings such as feeling anxious, agitated, panicky, irritable, hostile, aggressive, impulsive, severely restless, overly excited and hyperactive, or not being able to sleep. If this happens, especially at the beginning of treatment or after a change in dose, call your health care professional. You may get drowsy or dizzy. Do not drive, use machinery, or do anything that needs mental alertness until you know how this medicine affects you. Do not stand or sit up quickly, especially if you are an older patient. This reduces the risk of dizzy or fainting spells. Alcohol may interfere with the effect of this medicine. Avoid alcoholic drinks. Your mouth may get dry. Chewing sugarless gum or sucking hard candy, and drinking plenty of water may help. Contact your doctor if the problem does not go away or is severe. What side effects may I notice from receiving this medicine? Side effects that you should report to your doctor or health care professional as soon as possible:  allergic reactions like skin rash, itching or hives, swelling of the face, lips, or tongue  anxious  black, tarry stools  changes in vision  confusion  elevated mood, decreased need for sleep, racing thoughts, impulsive behavior  eye pain  fast, irregular heartbeat  feeling faint or lightheaded, falls  feeling agitated,  angry, or irritable  hallucination, loss of contact with reality  loss of balance or coordination  loss of memory  painful or prolonged erections  restlessness, pacing, inability to keep still  seizures  stiff muscles  suicidal thoughts or other mood changes  trouble sleeping  unusual bleeding or bruising  unusually weak or tired  vomiting Side effects that usually do not require medical attention (report to your doctor or health care professional if they continue or are bothersome):  change in appetite or weight  change in sex drive or performance  diarrhea  increased sweating  indigestion, nausea  tremors This list may not describe all possible side effects. Call your doctor for medical advice about side effects. You may report side effects to FDA at 1-800-FDA-1088. Where should I keep my medicine? Keep out of the reach of children. Store at room temperature between 15 and 30 degrees C (59 and 86 degrees F). Throw away any unused medicine after the expiration date. NOTE: This sheet is a summary. It may not cover all possible information. If you have questions about this medicine, talk to your doctor, pharmacist, or health care provider.  2020 Elsevier/Gold Standard (2017-12-25 10:09:27)  

## 2018-12-02 NOTE — Progress Notes (Signed)
Patient: Jason Stanton Male    DOB: Dec 21, 1985   33 y.o.   MRN: [id number] Visit Date: 12/02/2018  Today's Provider: Mar Daring, PA-C   Chief Complaint  Patient presents with  . Follow-up    HTN  . Anxiety   Subjective:     HPI  Essential hypertension/Chest pressure: this is a 6 week f/u.  BP Readings from Last 3 Encounters:  12/02/18 130/79  10/11/18 139/89  02/08/18 139/85   Management since that visit includes Stop amlodipine 10mg . Start amlodipine-benazepril 5-10mg . He reports excellent compliance with treatment. He is not having side effects.  He is experiencing chest pressure/discomfort.  Patient denies none.   Cardiovascular risk factors include none.  ------------------------------------------------------------------------ Patient has been having panic attacks and very emotional. He reports that his mother illness is getting to him. He reports that his wife is pregnant. He feels overwhelming. Is affecting his mood and everything around him with his kids. Feeling tired all the time and just doesn't feel like himself anymore. Sometimes he gets sharp pain. He reports that this has been worsening, is affecting his job. He sees that his mother is struggling a lot.  Depression screen [facility] 2/9 12/02/2018 02/08/2018 08/03/2017  Decreased Interest 1 0 0  Down, Depressed, Hopeless 1 0 0  PHQ - 2 Score 2 0 0  Altered sleeping 3 1 0  Tired, decreased energy 3 1 1   Change in appetite 0 0 0  Feeling bad or failure about yourself  0 0 0  Trouble concentrating 3 0 0  Moving slowly or fidgety/restless 0 0 0  Suicidal thoughts 0 0 0  PHQ-9 Score 11 2 1   Difficult doing work/chores Somewhat difficult Not difficult at all Not difficult at all   GAD 7 : Generalized Anxiety Score 12/02/2018  Nervous, Anxious, on Edge 3  Control/stop worrying 3  Worry too much - different things 2  Trouble relaxing 3  Restless 3  Easily annoyed or irritable 3  Afraid -  awful might happen 3  Total GAD 7 Score 20  Anxiety Difficulty Very difficult     Allergies  Allergen Reactions  . Stadol [Butorphanol] Anaphylaxis     Current Outpatient Medications:  .  amLODipine-benazepril (LOTREL) 5-10 MG capsule, Take 1 capsule by mouth daily., Disp: 90 capsule, Rfl: 1 .  amoxicillin (AMOXIL) 500 MG tablet, Take 500 mg by mouth 3 (three) times daily., Disp: , Rfl:   Review of Systems  Constitutional: Positive for fatigue.  Respiratory: Negative.   Cardiovascular: Negative.   Gastrointestinal: Negative.   Neurological: Negative.   Psychiatric/Behavioral: Positive for agitation, decreased concentration, dysphoric mood and sleep disturbance. The patient is nervous/anxious.     Social History   Tobacco Use  . Smoking status: Current Every Day Smoker    Packs/day: 0.50    Years: 2.00    Pack years: 1.00  . Smokeless tobacco: Never Used  Substance Use Topics  . Alcohol use: Yes    Alcohol/week: 1.0 - 2.0 standard drinks    Types: 1 - 2 Cans of beer per week      Objective:   BP 130/79 (BP Location: Left Arm, Patient Position: Sitting, Cuff Size: Large)   Pulse 90   Temp (!) 97.3 F (36.3 C) (Other (Comment))   Resp 16  Vitals:   12/02/18 1538  BP: 130/79  Pulse: 90  Resp: 16  Temp: (!) 97.3 F (36.3 C)  TempSrc: Other (Comment)  There is no height or weight on file to calculate BMI.   Physical Exam Vitals signs reviewed.  Constitutional:      General: He is not in acute distress.    Appearance: Normal appearance. He is well-developed. He is not ill-appearing or diaphoretic.  HENT:     Head: Normocephalic and atraumatic.  Neck:     Musculoskeletal: Normal range of motion and neck supple.  Cardiovascular:     Rate and Rhythm: Normal rate and regular rhythm.     Heart sounds: Normal heart sounds. No murmur. No friction rub. No gallop.   Pulmonary:     Effort: Pulmonary effort is normal. No respiratory distress.     Breath sounds:  Normal breath sounds. No wheezing or rales.  Neurological:     Mental Status: He is alert.  Psychiatric:        Attention and Perception: Attention and perception normal.        Mood and Affect: Mood is depressed.        Speech: Speech normal.        Behavior: Behavior normal. Behavior is cooperative.        Thought Content: Thought content normal.        Cognition and Memory: Cognition normal.        Judgment: Judgment normal.      No results found for any visits on 12/02/18.     Assessment & Plan    1. Essential hypertension BP stable. Continue Amlodipine-Benazepril 5-10mg .   2. Adjustment disorder with mixed anxiety and depressed mood Worsening due to mother's cancer diagnosis and he and wife expecting another child when he was not quite ready for another child (has an 17 yr old son and 87 yr old daughter). Will start sertraline as below. I will f/u in 4-6 weeks to see if improving and/or dose adjust/change therapy if failed. He is to call if he has adverse effects prior to.  - sertraline (ZOLOFT) 50 MG tablet; Take 1 tablet (50 mg total) by mouth daily. Start with 0.5 tab (25mg ) PO q hs x 1 week, then increase to 1 tab PO q hs  Dispense: 90 tablet; Refill: 0     , PA-C  [facility]

## 2019-01-06 ENCOUNTER — Telehealth: Payer: Self-pay | Admitting: Physician Assistant

## 2019-01-14 ENCOUNTER — Ambulatory Visit: Payer: 59 | Attending: Internal Medicine

## 2019-01-14 DIAGNOSIS — Z20822 Contact with and (suspected) exposure to covid-19: Secondary | ICD-10-CM

## 2019-01-15 LAB — NOVEL CORONAVIRUS, NAA: SARS-CoV-2, NAA: NOT DETECTED

## 2019-01-18 ENCOUNTER — Telehealth: Payer: 59 | Admitting: Family

## 2019-01-18 DIAGNOSIS — K0889 Other specified disorders of teeth and supporting structures: Secondary | ICD-10-CM

## 2019-01-18 MED ORDER — AMOXICILLIN 500 MG PO TABS: 500.0000 mg | ORAL_TABLET | Freq: Three times a day (TID) | ORAL | 0 refills | Status: DC

## 2019-01-18 NOTE — Progress Notes (Signed)
I have sent in Amoxil 500 mg to be taken 3 times a day. Please call your dentist on Monday and schedule an appointment.

## 2019-04-17 ENCOUNTER — Other Ambulatory Visit: Payer: Self-pay | Admitting: Physician Assistant

## 2019-04-17 DIAGNOSIS — I10 Essential (primary) hypertension: Secondary | ICD-10-CM

## 2019-04-23 ENCOUNTER — Telehealth: Payer: Self-pay

## 2019-04-23 ENCOUNTER — Other Ambulatory Visit: Payer: Self-pay | Admitting: Physician Assistant

## 2019-04-23 DIAGNOSIS — I10 Essential (primary) hypertension: Secondary | ICD-10-CM

## 2019-04-23 MED ORDER — AMLODIPINE BESY-BENAZEPRIL HCL 5-10 MG PO CAPS: 1.0000 | ORAL_CAPSULE | Freq: Every day | ORAL | 1 refills | Status: DC

## 2019-04-23 NOTE — Telephone Encounter (Signed)
Copied from CRM [id number]. Topic: Quick Communication - Rx Refill/Question >> Apr 23, 2019  8:58 AM Jaquita Rector A wrote: Medication: amLODipine-benazepril (LOTREL) 5-10 MG capsule  Has the patient contacted their pharmacy? Yes.   (Agent: If no, request that the patient contact the pharmacy for the refill.) (Agent: If yes, when and what did the pharmacy advise?)  Preferred Pharmacy (with phone number or street name): [facility] [address] [place], [place] - [address]  Phone:  [phone] Fax:  [phone]     Agent: Please be advised that RX refills may take up to 3 business days. We ask that you follow-up with your pharmacy.

## 2019-04-23 NOTE — Telephone Encounter (Signed)
Patient advised. He verbalized understanding.  

## 2019-04-23 NOTE — Telephone Encounter (Signed)
Let patient know swelling / mild pain under the armpit can occur on the side of the extremity the Covid vaccine was given in. Mild localized reactions are common, and usually seen with the second immunization.   Advise to rest, hydrate with water, ice/ cold packs to sore areas, and ok to take NSAID- such as Ibuprofen 600 mg every 8 hours and may also use Tylenol 500 mg tablet take 2 tablets every 6- 8 hours if needed.   If increasing pain, swelling or moderate to severe redness occurs, or other reactions please schedule an office visit for in person evaluation. Symptoms should start to resolve around seven days.

## 2019-04-23 NOTE — Telephone Encounter (Signed)
Copied from CRM [id number]. Topic: General - Other >> Apr 23, 2019  9:00 AM Jaquita Rector A wrote: Reason for CRM: Patient called to inform Dr Rosezetta Schlatter that he had his second covid vaccine and that he is not feeling very well. States that on 04/22/19 he stayed in bed getting up only to use the bathroom. States that he have a pain on the inner part of his armpit from the shot. Patient would like a call back with what he can possibly do to feel better Ph# [phone]

## 2019-07-04 ENCOUNTER — Ambulatory Visit: Payer: 59 | Admitting: Physician Assistant

## 2019-07-04 NOTE — Progress Notes (Deleted)
     Established patient visit   Patient: Jason Stanton   DOB: 02/22/1985   34 y.o. Male  MRN: [id number] Visit Date: 07/04/2019  Today's healthcare provider: Margaretann Loveless, PA-C   No chief complaint on file.  Subjective    HPI Patient needing referral for vasectomy.  Follow up for Anxiety  The patient was last seen for this 7 months ago. Changes made at last visit include Will start sertraline   He reports {excellent/good/fair/poor:19665} compliance with treatment. He feels that condition is {improved/worse/unchanged:3041574}. He {is/is not:21021397} having side effects. ***  -----------------------------------------------------------------------------------------  {Show patient history (optional):23778::" "}   Medications: Outpatient Medications Prior to Visit  Medication Sig  . amLODipine-benazepril (LOTREL) 5-10 MG capsule Take 1 capsule by mouth daily.  Marland Kitchen amoxicillin (AMOXIL) 500 MG tablet Take 1 tablet (500 mg total) by mouth 3 (three) times daily.  . sertraline (ZOLOFT) 50 MG tablet Take 1 tablet (50 mg total) by mouth daily. Start with 0.5 tab (25mg ) PO q hs x 1 week, then increase to 1 tab PO q hs   No facility-administered medications prior to visit.    Review of Systems  {Heme  Chem  Endocrine  Serology  Results Review (optional):23779::" "}  Objective    There were no vitals taken for this visit. {Show previous vital signs (optional):23777::" "}  Physical Exam  ***  No results found for any visits on 07/04/19.  Assessment & Plan     ***  No follow-ups on file.      {provider attestation***:1}   07/06/19  [facility] [phone] (phone) [phone] (fax)  [facility]

## 2019-07-15 NOTE — Progress Notes (Signed)
Established patient visit   Patient: Jason Stanton   DOB: 08/18/1985   34 y.o. Male  MRN: [id number] Visit Date: 07/17/2019  Today's healthcare provider: Margaretann Loveless, PA-C   Chief Complaint  Patient presents with  . Referral   Subjective    HPI Patient coming wanting a referral to have a vasectomy. Has 3 children age 16,4, and 4 weeks.   Follow up for adjustment disorder with mixed anxiety and depressed mood.  The patient was last seen for this 7 months ago. Changes made at last visit include start sertraline.  He feels that condition is Unchanged. He is not having side effects. Reports that he took the Sertraline for 2 months but he didn't feel any changes. Also made him feel sleepy.  Still having worrying thoughts about his mother and his daughter as they both have medical issues but feels he is coping ok on his own at this time. -----------------------------------------------------------------------------------------   Patient Active Problem List   Diagnosis Date Noted  . Essential hypertension 02/08/2018   History reviewed. No pertinent surgical history. Social History   Tobacco Use  . Smoking status: Current Every Day Smoker    Packs/day: 0.50    Years: 2.00    Pack years: 1.00  . Smokeless tobacco: Never Used  Vaping Use  . Vaping Use: Never used  Substance Use Topics  . Alcohol use: Yes    Alcohol/week: 1.0 - 2.0 standard drink    Types: 1 - 2 Cans of beer per week  . Drug use: No       Medications: Outpatient Medications Prior to Visit  Medication Sig  . amLODipine-benazepril (LOTREL) 5-10 MG capsule Take 1 capsule by mouth daily.  Marland Kitchen amoxicillin (AMOXIL) 500 MG tablet Take 1 tablet (500 mg total) by mouth 3 (three) times daily.  . sertraline (ZOLOFT) 50 MG tablet Take 1 tablet (50 mg total) by mouth daily. Start with 0.5 tab (25mg ) PO q hs x 1 week, then increase to 1 tab PO q hs   No facility-administered medications prior to  visit.    Review of Systems  Constitutional: Negative.   Respiratory: Negative.   Cardiovascular: Negative.   Neurological: Negative.   Hematological: Negative.     Last CBC Lab Results  Component Value Date   WBC 6.2 10/11/2018   HGB 16.2 10/11/2018   HCT 45.8 10/11/2018   MCV 89 10/11/2018   MCH 31.5 10/11/2018   RDW 11.7 10/11/2018   PLT 286 10/11/2018   Last metabolic panel Lab Results  Component Value Date   GLUCOSE 96 10/11/2018   NA 139 10/11/2018   K 4.5 10/11/2018   CL 101 10/11/2018   CO2 22 10/11/2018   BUN 11 10/11/2018   CREATININE 1.14 10/11/2018   GFRNONAA 85 10/11/2018   GFRAA 98 10/11/2018   CALCIUM 9.9 10/11/2018   PROT 7.7 10/11/2018   ALBUMIN 4.9 10/11/2018   LABGLOB 2.8 10/11/2018   AGRATIO 1.8 10/11/2018   BILITOT 0.5 10/11/2018   ALKPHOS 71 10/11/2018   AST 78 (H) 10/11/2018   ALT 101 (H) 10/11/2018   ANIONGAP 11 08/16/2016      Objective    BP 127/88 (BP Location: Left Arm, Patient Position: Sitting, Cuff Size: Large)   Pulse 88   Temp (!) 97.5 F (36.4 C) (Temporal)   Resp 16   Wt 231 lb 9.6 oz (105.1 kg)   BMI 31.41 kg/m  BP Readings from Last 3 Encounters:  07/17/19 127/88  12/02/18 130/79  10/11/18 139/89   Wt Readings from Last 3 Encounters:  07/17/19 231 lb 9.6 oz (105.1 kg)  10/11/18 236 lb (107 kg)  02/08/18 235 lb (106.6 kg)      Physical Exam Vitals reviewed.  Constitutional:      General: He is not in acute distress.    Appearance: Normal appearance. He is well-developed. He is obese. He is not ill-appearing.  HENT:     Head: Normocephalic and atraumatic.  Eyes:     General: No scleral icterus.    Conjunctiva/sclera: Conjunctivae normal.  Pulmonary:     Effort: Pulmonary effort is normal. No respiratory distress.  Musculoskeletal:     Cervical back: Normal range of motion and neck supple.  Neurological:     Mental Status: He is alert.  Psychiatric:        Mood and Affect: Mood normal.         Behavior: Behavior normal.        Thought Content: Thought content normal.        Judgment: Judgment normal.       No results found for any visits on 07/17/19.  Assessment & Plan     1. Encounter for vasectomy Referral placed to [facility]. Patient request appt asap since he is currently out of work until later this month.  - Ambulatory referral to Urology  2. Essential hypertension Stable. Diagnosis pulled for medication refill. Continue current medical treatment plan. - amLODipine-benazepril (LOTREL) 5-10 MG capsule; Take 1 capsule by mouth daily.  Dispense: 90 capsule; Refill: 1  3. Situational mixed anxiety and depressive disorder Stable. External triggers with mother's and daughter's health. Discussed CBT. Patient will call if desires.   No follow-ups on file.      Delmer Islam, PA-C, have reviewed all documentation for this visit. The documentation on 07/17/19 for the exam, diagnosis, procedures, and orders are all accurate and complete.   Reine Just  [facility] [phone] (phone) [phone] (fax)  [facility]

## 2019-07-17 ENCOUNTER — Encounter: Payer: Self-pay | Admitting: Physician Assistant

## 2019-07-17 ENCOUNTER — Ambulatory Visit: Payer: 59 | Admitting: Physician Assistant

## 2019-07-17 ENCOUNTER — Other Ambulatory Visit: Payer: Self-pay

## 2019-07-17 VITALS — BP 127/88 | HR 88 | Temp 97.5°F | Resp 16 | Wt 231.6 lb

## 2019-07-17 DIAGNOSIS — Z302 Encounter for sterilization: Secondary | ICD-10-CM | POA: Diagnosis not present

## 2019-07-17 DIAGNOSIS — I10 Essential (primary) hypertension: Secondary | ICD-10-CM

## 2019-07-17 DIAGNOSIS — F4323 Adjustment disorder with mixed anxiety and depressed mood: Secondary | ICD-10-CM

## 2019-07-17 MED ORDER — AMLODIPINE BESY-BENAZEPRIL HCL 5-10 MG PO CAPS: 1.0000 | ORAL_CAPSULE | Freq: Every day | ORAL | 1 refills | Status: DC

## 2019-07-17 NOTE — Patient Instructions (Signed)
Vasectomy Vasectomy is a procedure in which the tube that carries sperm from the testicle to the urethra (vas deferens) is tied. It may also be cut. The procedure blocks sperm from going through the vas deferens and penis during ejaculation. This ensures that sperm does not go into the vagina during sex. Vasectomy does not affect your sexual desire or performance, and does not prevent sexually transmitted diseases. Vasectomy is considered a permanent and very effective form of birth control (contraception). The decision to have a vasectomy should not be made during a stressful situation, such as after the loss of a pregnancy or a divorce. You and your partner should make the decision to have a vasectomy when you are sure that you do not want children in the future. Tell a health care provider about:  Any allergies you have.  All medicines you are taking, including vitamins, herbs, eye drops, creams, and over-the-counter medicines.  Any problems you or family members have had with anesthetic medicines.  Any blood disorders you have.  Any surgeries you have had.  Any medical conditions you have. What are the risks? Generally, this is a safe procedure. However, problems may occur, including:  Infection.  Bleeding and swelling of the scrotum.  Allergic reactions to medicines.  Failure of the procedure to prevent pregnancy. There is a very small chance that the cut ends of the vas deferens may reconnect (recanalization), meaning that you could still make a woman pregnant.  Pain in the scrotum that continues after healing from the procedure. What happens before the procedure?  Ask your health care provider about: ? Changing or stopping your regular medicines. This is especially important if you are taking diabetes medicines or blood thinners. ? Taking over-the-counter medicines, vitamins, herbs, and supplements. ? Taking medicines such as aspirin and ibuprofen. These medicines can thin  your blood. Do not take these medicines unless your health care provider tells you to take them.  You may be asked to shower with a germ-killing soap.  Plan to have someone take you home from the hospital or clinic. What happens during the procedure?   To lower your risk of infection: ? Your health care team will wash or sanitize their hands. ? Hair may be removed from the surgical area. ? Your scrotum will be washed with soap.  You will be given one or more of the following: ? A medicine to help you relax (sedative). You may be instructed to take this a few hours before the procedure. ? A medicine to numb the area (local anesthetic).  Your health care provider will feel (palpate) for your vas deferens.  To reach the vas deferens, one of two methods may be used: ? A very small incision may be made in your scrotum. ? A punctured opening may be made in your scrotum, without an incision.  Your vas deferens will be pulled out of your scrotum, and may be: ? Tied off. ? Cut and possibly burned (cauterized) at the ends to seal them off.  The vas deferens will be put back into your scrotum.  The incision or puncture opening will be closed with absorbable stitches (sutures). The sutures will eventually dissolve and will not need to be removed after the procedure. The procedure may vary among health care providers and hospitals. What happens after the procedure?  You will be monitored to make sure that you do not experience problems.  You will be asked not to ejaculate for at least 1 week after   the procedure, or as long as directed.  You will need to use a different form of contraception for 2-4 months after the procedure, until you have test results confirming that there are no sperm in your semen.  You may be given scrotal support to wear, such as a jock strap or underwear with a supportive pouch.  Do not drive for 24 hours if you were given a sedative to help you  relax. Summary  Vasectomy is considered a permanent and very effective form of birth control (contraception). The procedure prevents sperm from being released during ejaculation.  Your scrotum will be numbed with medicine (local anesthetic) for the procedure.  After the procedure, you will be asked not to ejaculate for at least 1 week, or for as long as directed. You will also need to use a different form of contraception until your health care provider examines you and finds that there are no sperm in your semen. This information is not intended to replace advice given to you by your health care provider. Make sure you discuss any questions you have with your health care provider. Document Revised: 07/06/2017 Document Reviewed: 03/31/2016 Elsevier Patient Education  2020 Elsevier Inc.  

## 2019-08-08 ENCOUNTER — Ambulatory Visit (INDEPENDENT_AMBULATORY_CARE_PROVIDER_SITE_OTHER): Payer: 59 | Admitting: Urology

## 2019-08-08 ENCOUNTER — Other Ambulatory Visit: Payer: Self-pay

## 2019-08-08 ENCOUNTER — Encounter: Payer: Self-pay | Admitting: Urology

## 2019-08-08 VITALS — BP 131/79 | HR 86 | Ht 72.0 in | Wt 228.0 lb

## 2019-08-08 DIAGNOSIS — Z3009 Encounter for other general counseling and advice on contraception: Secondary | ICD-10-CM | POA: Diagnosis not present

## 2019-08-08 NOTE — Patient Instructions (Signed)

## 2019-08-09 ENCOUNTER — Encounter: Payer: Self-pay | Admitting: Urology

## 2019-08-09 MED ORDER — DIAZEPAM 10 MG PO TABS: ORAL_TABLET | ORAL | 0 refills | Status: DC

## 2019-08-09 NOTE — Progress Notes (Signed)
08/08/2019 10:32 PM   Jason Stanton 13-Apr-1985 [id number]  Referring provider: Margaretann Loveless, PA-C [address] [place],  [place] [address]  Chief Complaint  Patient presents with   VAS Consult    HPI: Jason Stanton is a 34 y.o. male who presents for vasectomy counseling.   Married with 3 children  Desire vasectomy as a means permanent sterilization  Prior history of stone disease  No chronic scrotal pain or history epididymitis/orchitis  No history of bleeding or clotting disorders  No prior inguinal, GU or pelvic surgery   PMH: Past Medical History:  Diagnosis Date   Heart murmur     Surgical History: History reviewed. No pertinent surgical history.  Home Medications:  Allergies as of 08/08/2019      Reactions   Stadol [butorphanol] Anaphylaxis      Medication List       Accurate as of August 08, 2019 11:59 PM. If you have any questions, ask your nurse or doctor.        amLODipine-benazepril 5-10 MG capsule Commonly known as: Lotrel Take 1 capsule by mouth daily.       Allergies:  Allergies  Allergen Reactions   Stadol [Butorphanol] Anaphylaxis    Family History: Family History  Problem Relation Age of Onset   Cancer Mother     Social History:  reports that he has been smoking. He has a 1.00 pack-year smoking history. He has never used smokeless tobacco. He reports current alcohol use of about 1.0 - 2.0 standard drink of alcohol per week. He reports that he does not use drugs.   Physical Exam: BP (!) 131/79    Pulse 86    Ht 6' (1.829 m)    Wt (!) 228 lb (103.4 kg)    BMI 30.92 kg/m   Constitutional:  Alert and oriented, No acute distress. HEENT: [place] AT, moist mucus membranes.  Trachea midline, no masses. Cardiovascular: No clubbing, cyanosis, or edema. Respiratory: Normal respiratory effort, no increased work of breathing. GI: Abdomen is soft, nontender, nondistended, no abdominal masses GU: Phallus  without lesions; testes descended bilaterally without masses or tenderness.  Right spermatic cord thick but vas palpable, left vasa easily palpable Skin: No rashes, bruises or suspicious lesions. Neurologic: Grossly intact, no focal deficits, moving all 4 extremities. Psychiatric: Normal mood and affect.   Assessment & Plan:    1. Encounter for vasectomy counseling  Desires to schedule vasectomy  Desired preprocedure anxiolytic and Rx Valium sent to pharmacy  We had a long discussion about vasectomy. We specifically discussed the procedure, recovery and the risks, benefits and alternatives of vasectomy. I explained that the procedure entails removal of a segment of each vas deferens, each of which conducts sperm, and that the purpose of this procedure is to cause sterility (inability to produce children or cause pregnancy). Vasectomy is intended to be permanent and irreversible form of contraception. Options for fertility after vasectomy include vasectomy reversal, or sperm retrieval with in vitro fertilization. These options are not always successful, and they may be expensive. We discussed reversible forms of birth control such as condoms, IUD or diaphragms, as well as the option of freezing sperm in a sperm bank prior to the vasectomy procedure. We discussed the importance of avoiding strenuous exercise for four days after vasectomy, and the importance of refraining from any form of ejaculation for seven days after vasectomy. I explained that vasectomy does not produce immediate sterility so another form of contraceptive must  be used until sterility is assured by having semen checked for sperm. Thus, a post vasectomy semen analysis is necessary to confirm sterility. Rarely, vasectomy must be repeated. We discussed the approximately 1 in 2,000 risk of pregnancy after vasectomy for men who have post-vasectomy semen analysis showing absent sperm or rare non-motile sperm. Typical side effects include a  small amount of oozing blood, some discomfort and mild swelling in the area of incision.  Vasectomy does not affect sexual performance, function, please, sensation, interest, desire, satisfaction, penile erection, volume of semen or ejaculation. Other rare risks include allergy or adverse reaction to an anesthetic, testicular atrophy, hematoma, infection/abscess, prolonged tenderness of the vas deferens, pain, swelling, painful nodule or scar (called sperm granuloma) or epididymtis. We discussed chronic testicular pain syndrome. This has been reported to occur in as many as 1-2% of men and may be permanent. This can be treated with medication, small procedures or (rarely) surgery.   Riki Altes, MD  [facility] [address], [address] [place], [place] [address] [phone]

## 2019-09-12 ENCOUNTER — Ambulatory Visit (INDEPENDENT_AMBULATORY_CARE_PROVIDER_SITE_OTHER): Payer: 59 | Admitting: Urology

## 2019-09-12 ENCOUNTER — Encounter: Payer: Self-pay | Admitting: Urology

## 2019-09-12 ENCOUNTER — Other Ambulatory Visit: Payer: Self-pay

## 2019-09-12 VITALS — BP 160/90 | HR 118 | Ht 72.0 in | Wt 228.0 lb

## 2019-09-12 DIAGNOSIS — Z302 Encounter for sterilization: Secondary | ICD-10-CM | POA: Diagnosis not present

## 2019-09-12 MED ORDER — HYDROCODONE-ACETAMINOPHEN 5-325 MG PO TABS: 1.0000 | ORAL_TABLET | Freq: Four times a day (QID) | ORAL | 0 refills | Status: DC | PRN

## 2019-09-12 NOTE — Progress Notes (Signed)
Vasectomy Procedure Note  Indications: The patient is a 34 y.o. male who presents today for elective sterilization.  He has been consented for the procedure.  He is aware of the risks and benefits.  He had no additional questions.  He agrees to proceed.  He denies any other significant change since his last visit.  Pre-operative Diagnosis: Elective sterilization  Post-operative Diagnosis: Elective sterilization  Premedication: Valium 10 mg po  Surgeon: Lorin Picket C. Nezar Buckles, M.D  Description: The patient was prepped and draped in the standard fashion.  The right vas deferens was identified and brought superiorly to the anterior scrotal skin.  The skin and vas was then anesthetized utilizing 9 ml 1% lidocaine.  A small stab incision was made and spread with the vas dissector.  The vas was grasped utilizing the vas clamp and elevated out of the incision.  The vas was dissected free from surrounding tissue and vessels and an ~1 cm segment was excised.  The vas lumens were cauterized utilizing electrocautery.  The distal segment was buried in the surrounding sheath with a 3-0 chromic suture.  No significant bleeding was observed.  The vas ends were then dropped back into the hemiscrotum.  The skin was closed with hemostatic pressure.  An identical procedure was performed on the contralateral side.  Clean dry gauze was applied to the incision sites.  The patient tolerated the procedure well.  Complications:None  Recommendations: 1.  No lifting greater than 10 pounds or strenuousactivity for 1 week. 2.  Scrotal support for 1 week. 3.  Shower only for 1 week; may shower in the morning 4.  May resume intercourse in one week if no significant discomfort.  Continue alternate contraception for 12 weeks.  5.  Call for significant pain, swelling, redness, drainage or fever greater than 100.5. 6.  Rx hydrocodone/APAP 5/325 1-2 every 6 hours as needed for pain. 7.  Follow-up semen analysis in 12 weeks.

## 2019-09-12 NOTE — Patient Instructions (Signed)
Vasectomy, Care After °This sheet gives you information about how to care for yourself after your procedure. Your health care provider may also give you more specific instructions. If you have problems or questions, contact your health care provider. °What can I expect after the procedure? °After your procedure, it is common to have: °· Mild pain, swelling, redness, or discomfort in your scrotum. °· Some blood coming from your incisions or puncture sites for one or two days. °· Blood in your semen. °Follow these instructions at home: °Medicines ° °· Take over-the-counter and prescription medicines only as told by your health care provider. °· Avoid taking NSAIDs such as aspirin and ibuprofen, because these medicines can make bleeding worse. °Activity °· For the first 2 days after surgery, avoid physical activity and exercise that require a lot of energy. Ask your health care provider what activities are safe for you. °· Do not participate in sports or perform heavy physical labor until your pain has improved, or until your health care provider says it is okay. °· Do not ejaculate for at least 1 week after the procedure, or as long as directed. °· You may resume sexual activity 7-10 days after your procedure, or when your health care provider approves. Use a different method of birth control (contraception) until you have had test results that confirm that there is no sperm in your semen. °Scrotal support °· Use scrotal support, such as a jock strap or underwear with a supportive pouch, as needed for one week after your procedure. °· If you feel discomfort in your scrotum, you may remove the scrotal support to see if the discomfort is relieved. Sometimes scrotal support can press on the scrotum and cause or worsen discomfort. °· If your skin gets irritated, you may add some germ-free (sterile), fluffed bandages or a clean washcloth to the scrotal support. °General instructions °· Put ice on the injured area: °? Put  ice in a plastic bag. °? Place a towel between your skin and the bag. °? Leave the ice on for 20 minutes, 2-3 times a day. °· Check your incisions or puncture sites every day for signs of infection. Check for: °? Redness, swelling, or pain. °? Fluid or blood. °? Warmth. °? Pus or a bad smell. °· Leave stitches (sutures) in place. The sutures will dissolve on their own and do not need to be removed. °· Keep all follow-up visits as told by your health care provider. This is important because you will need a test to confirm that there is no sperm in your semen. Multiple ejaculations are needed to clear out sperm that were beyond the vasectomy site. You will need one test result showing that there is no sperm in your semen before you can resume unprotected sex. This may take 2-4 months after your procedure. °· Do not drive for 24 hours if you were given a sedative to help you relax. °Contact a health care provider if: °· You have redness, swelling, or more pain around your incision or puncture site, or in your scrotum area in general. °· You have bleeding from your incision or puncture site. °· You have pus or a bad smell coming from your incision or puncture site. °· You have a fever. °· Your incision or puncture site opens up. °Get help right away if: °· You develop a rash. °· You have difficulty breathing. °Summary °· After your procedure it is common to have mild pain, swelling, redness, or discomfort in your scrotum. °·   Avoid physical activity and exercise that requires a lot of energy for the first 2 days after surgery. °· Put ice on the injured area. Leave the ice on for 20 minutes, 2-3 times a day. °· Do not drive for 24 hours if you were given a sedative to help you relax. °This information is not intended to replace advice given to you by your health care provider. Make sure you discuss any questions you have with your health care provider. °Document Revised: 12/15/2016 Document Reviewed: 03/31/2016 °Elsevier  Patient Education © 2020 Elsevier Inc. ° °

## 2019-09-15 ENCOUNTER — Ambulatory Visit: Payer: Self-pay | Admitting: *Deleted

## 2019-09-15 ENCOUNTER — Telehealth: Payer: Self-pay | Admitting: Radiology

## 2019-09-15 NOTE — Telephone Encounter (Signed)
Also advised patient to call back if swelling doesn't improve.

## 2019-09-15 NOTE — Telephone Encounter (Signed)
Patient reports testicular swelling after working outside at his job after vasectomy on Friday 09/12/2019. Advised patient to refrain from lifting greater than 10 pounds x 1 week, to wear supportive underwear and to ice the area. Is there anything else he needs to do?

## 2019-09-15 NOTE — Telephone Encounter (Signed)
Patient called c/o feeling anxious and feeling like he is getting depressed. Patient reports last night feeling SOB and anxiety. Nightmares reported at times. Wife wanted to call EMS but patient refused. Denies suicidal ideations. Denies chest pain. Patient requesting medication to help with sudden anxiety and can feel he wants to stay in bed. Multiple family health issues reported causing stress. Symptoms noted for a couple of weeks and getting worse. Provided patient with urgent crisis center # [phone] at [facility] . appt made for MyChart visit on 09/18/19. Care advise given. Patient verbalized understanding of care advise and to call back or go to [facility] or ED if symptoms worsen.  Reason for Disposition  [1] Started on anti-anxiety medication AND [2] no relief  Answer Assessment - Initial Assessment Questions 1. CONCERN: "What happened that made you call today?"     Crying a lot and doesn't want to get out of bed. 2. ANXIETY SYMPTOM SCREENING: "Can you describe how you have been feeling?"  (e.g., tense, restless, panicky, anxious, keyed up, trouble sleeping, trouble concentrating)     I get Hot and my head shoulder blades tense , get angry for no reason 3. ONSET: "How long have you been feeling this way?"     A couple weeks 4. RECURRENT: "Have you felt this way before?"  If Yes, ask: "What happened that time?" "What helped these feelings go away in the past?"      When daughter went through heath issues with kidney  5. RISK OF HARM - SUICIDAL IDEATION:  "Do you ever have thoughts of hurting or killing yourself?"  (e.g., yes, no, no but preoccupation with thoughts about death)   - INTENT:  "Do you have thoughts of hurting or killing yourself right NOW?" (e.g., yes, no, N/A)   - PLAN: "Do you have a specific plan for how you would do this?" (e.g., gun, knife, overdose, no plan, N/A)     no 6. RISK OF HARM - HOMICIDAL IDEATION:  "Do you ever have thoughts of hurting or killing  someone else?"  (e.g., yes, no, no but preoccupation with thoughts about death)   - INTENT:  "Do you have thoughts of hurting or killing someone right NOW?" (e.g., yes, no, N/A)   - PLAN: "Do you have a specific plan for how you would do this?" (e.g., gun, knife, no plan, N/A)      no 7. FUNCTIONAL IMPAIRMENT: "How have things been going for you overall? Have you had more difficulty than usual doing your normal daily activities?"  (e.g., better, same, worse; self-care, school, work, interactions)     Overall ok but I am trying to be there for everyone, mother has CA, daughter just went through heath issue and patient just had vasectomy last week. 8. SUPPORT: "Who is with you now?" "Who do you live with?" "Do you have family or friends who you can talk to?"      Patient alone now in work [employer]. Lives with wife and children, family is brother and sister. Patient only talks to wife  46. THERAPIST: "Do you have a counselor or therapist? Name?"     No does not want a counselor or therapist 10. STRESSORS: "Has there been any new stress or recent changes in your life?"       Yes. Mother dx with CA, recent vasectomy, daughter just finished with health scare involving kidneys 64. CAFFEINE USE: "Do you drink caffeinated beverages, and how much each day?" (e.g., coffee, tea,  colas)       Tea, more than I should 12. ALCOHOL USE OR SUBSTANCE USE (DRUG USE): "Do you drink alcohol or use any illegal drugs?"       Does drink alcohol but feels he manages amount no illegal drugs 13. OTHER SYMPTOMS: "Do you have any other physical symptoms right now?" (e.g., chest pain, palpitations, difficulty breathing, fever)       Pain from vasectomy, only taking tylenol. Does feel tense in shoulders and gets hot ,and angry.  Protocols used: ANXIETY AND PANIC ATTACK-A-AH

## 2019-09-16 ENCOUNTER — Encounter: Payer: Self-pay | Admitting: Urology

## 2019-09-16 NOTE — Telephone Encounter (Signed)
Advised patient he may take ibuprofen per Dr Lonna Cobb. Patient verbalized understanding and reports improvement in swelling.

## 2019-09-16 NOTE — Telephone Encounter (Signed)
Would have him take ibuprofen if no side effects to this medication

## 2019-09-18 ENCOUNTER — Telehealth (INDEPENDENT_AMBULATORY_CARE_PROVIDER_SITE_OTHER): Payer: 59 | Admitting: Physician Assistant

## 2019-09-18 ENCOUNTER — Encounter: Payer: Self-pay | Admitting: Physician Assistant

## 2019-09-18 DIAGNOSIS — F4323 Adjustment disorder with mixed anxiety and depressed mood: Secondary | ICD-10-CM

## 2019-09-18 MED ORDER — LORAZEPAM 0.5 MG PO TABS: 0.5000 mg | ORAL_TABLET | Freq: Every day | ORAL | 5 refills | Status: DC | PRN

## 2019-09-18 NOTE — Progress Notes (Signed)
MyChart Video Visit    Virtual Visit via Video Note   This visit type was conducted due to national recommendations for restrictions regarding the COVID-19 Pandemic (e.g. social distancing) in an effort to limit this patient's exposure and mitigate transmission in our community. This patient is at least at moderate risk for complications without adequate follow up. This format is felt to be most appropriate for this patient at this time. Physical exam was limited by quality of the video and audio technology used for the visit.   I connected with Jason Stanton. Jason Stanton on 09/18/19 at  5:40 PM EDT by a video enabled telemedicine application and verified that I am speaking with the correct person using two identifiers.  I discussed the limitations of evaluation and management by telemedicine and the availability of in person appointments. The patient expressed understanding and agreed to proceed.  Patient location: Home Provider location: [facility]   Patient: Jason Stanton   DOB: 01/12/1986   33 y.o. Male  MRN: [id number] Visit Date: 09/18/2019  Today's healthcare provider: Margaretann Loveless, PA-C   Chief Complaint  Patient presents with  . Anxiety   Subjective    HPI  Patient reports that his anxiety is worsening. He had chest tightness, pain, reports that every breath he took and it was like he kept adding another pound to his chest. This happened Sunday night. Reports his sister in law gave him Ativan and it worked right away.  Reports they have recently been given bad news about his mother. She has been undergoing testing for a transplant and had been thought her brother was a Microbiologist. She was one day from starting chemotherapy to lower her immune system when it was found out the test had been read inappropriately and her brother was not a Microbiologist. This was all done through [facility]. They are now switching care to [facility] and having to start all over.   He has tried sertraline in the past but  had adverse effects.   Patient Active Problem List   Diagnosis Date Noted  . Essential hypertension 02/08/2018   Past Medical History:  Diagnosis Date  . Heart murmur       Medications: Outpatient Medications Prior to Visit  Medication Sig  . amLODipine-benazepril (LOTREL) 5-10 MG capsule Take 1 capsule by mouth daily.  . diazepam (VALIUM) 10 MG tablet 1 tab po 30 min prior to procedure (Patient not taking: Reported on 09/18/2019)  . HYDROcodone-acetaminophen (NORCO/VICODIN) 5-325 MG tablet Take 1 tablet by mouth every 6 (six) hours as needed for moderate pain.   No facility-administered medications prior to visit.    Review of Systems  Constitutional: Negative.   Respiratory: Negative.   Cardiovascular: Negative.   Psychiatric/Behavioral: Positive for agitation, decreased concentration, dysphoric mood and sleep disturbance. The patient is nervous/anxious.     Last CBC Lab Results  Component Value Date   WBC 6.2 10/11/2018   HGB 16.2 10/11/2018   HCT 45.8 10/11/2018   MCV 89 10/11/2018   MCH 31.5 10/11/2018   RDW 11.7 10/11/2018   PLT 286 10/11/2018   Last metabolic panel Lab Results  Component Value Date   GLUCOSE 96 10/11/2018   NA 139 10/11/2018   K 4.5 10/11/2018   CL 101 10/11/2018   CO2 22 10/11/2018   BUN 11 10/11/2018   CREATININE 1.14 10/11/2018   GFRNONAA 85 10/11/2018   GFRAA 98 10/11/2018   CALCIUM 9.9 10/11/2018   PROT 7.7 10/11/2018  ALBUMIN 4.9 10/11/2018   LABGLOB 2.8 10/11/2018   AGRATIO 1.8 10/11/2018   BILITOT 0.5 10/11/2018   ALKPHOS 71 10/11/2018   AST 78 (H) 10/11/2018   ALT 101 (H) 10/11/2018   ANIONGAP 11 08/16/2016      Objective    There were no vitals taken for this visit. BP Readings from Last 3 Encounters:  09/12/19 (!) 160/90  08/08/19 (!) 131/79  07/17/19 127/88   Wt Readings from Last 3 Encounters:  09/12/19 228 lb (103.4 kg)  08/08/19 (!) 228 lb (103.4 kg)  07/17/19 231 lb 9.6 oz (105.1 kg)      Physical  Exam Vitals reviewed.  Constitutional:      General: He is not in acute distress.    Appearance: Normal appearance. He is well-developed. He is not ill-appearing.  HENT:     Head: Normocephalic and atraumatic.  Pulmonary:     Effort: Pulmonary effort is normal. No respiratory distress.  Musculoskeletal:     Cervical back: Normal range of motion and neck supple.  Neurological:     Mental Status: He is alert.  Psychiatric:        Mood and Affect: Mood normal.        Behavior: Behavior normal.        Thought Content: Thought content normal.        Judgment: Judgment normal.        Assessment & Plan     1. Situational mixed anxiety and depressive disorder Will give low dose ativan as below for acute anxiety issues. Will monitor usage and patient aware if use escalates we will want to discuss starting SSRI/SNRI to help as well. He agrees. Call if worsening. - LORazepam (ATIVAN) 0.5 MG tablet; Take 1 tablet (0.5 mg total) by mouth daily as needed for anxiety.  Dispense: 10 tablet; Refill: 5   No follow-ups on file.     I discussed the assessment and treatment plan with the patient. The patient was provided an opportunity to ask questions and all were answered. The patient agreed with the plan and demonstrated an understanding of the instructions.   The patient was advised to call back or seek an in-person evaluation if the symptoms worsen or if the condition fails to improve as anticipated.  I provided 19 minutes of non-face-to-face time during this encounter.  Delmer Islam, PA-C, have reviewed all documentation for this visit. The documentation on 09/18/19 for the exam, diagnosis, procedures, and orders are all accurate and complete.  Reine Just [facility] [phone] (phone) [phone] (fax)  [facility]

## 2019-09-18 NOTE — Patient Instructions (Signed)
10 Relaxation Techniques That Zap Stress Fast By Jeannette Moninger   Listen  Relax. You deserve it, it's good for you, and it takes less time than you think. You don't need a spa weekend or a retreat. Each of these stress-relieving tips can get you from OMG to om in less than 15 minutes. 1. Meditate  A few minutes of practice per day can help ease anxiety. "Research suggests that daily meditation may alter the brain's neural pathways, making you more resilient to stress," says psychologist Robbie Maller Hartman, PhD, a [place] health and wellness coach. It's simple. Sit up straight with both feet on the floor. Close your eyes. Focus your attention on reciting -- out loud or silently -- a positive mantra such as "I feel at peace" or "I love myself." Place one hand on your belly to sync the mantra with your breaths. Let any distracting thoughts float by like clouds. 2. Breathe Deeply  Take a 5-minute break and focus on your breathing. Sit up straight, eyes closed, with a hand on your belly. Slowly inhale through your nose, feeling the breath start in your abdomen and work its way to the top of your head. Reverse the process as you exhale through your mouth.  "Deep breathing counters the effects of stress by slowing the heart rate and lowering blood pressure," psychologist Judith Tutin, PhD, says. She's a certified life coach in [place], [place] 3. Be Present  Slow down.  "Take 5 minutes and focus on only one behavior with awareness," Tutin says. Notice how the air feels on your face when you're walking and how your feet feel hitting the ground. Enjoy the texture and taste of each bite of food. When you spend time in the moment and focus on your senses, you should feel less tense. 4. Reach Out  Your social network is one of your best tools for handling stress. Talk to others -- preferably face to face, or at least on the phone. Share what's going on. You can get a fresh perspective while keeping your  connection strong. 5. Tune In to Your Body  Mentally scan your body to get a sense of how stress affects it each day. Lie on your back, or sit with your feet on the floor. Start at your toes and work your way up to your scalp, noticing how your body feels.  "Simply be aware of places you feel tight or loose without trying to change anything," Tutin says. For 1 to 2 minutes, imagine each deep breath flowing to that body part. Repeat this process as you move your focus up your body, paying close attention to sensations you feel in each body part. 6. Decompress  Place a warm heat wrap around your neck and shoulders for 10 minutes. Close your eyes and relax your face, neck, upper chest, and back muscles. Remove the wrap, and use a tennis ball or foam roller to massage away tension.  "Place the ball between your back and the wall. Lean into the ball, and hold gentle pressure for up to 15 seconds. Then move the ball to another spot, and apply pressure," says Cathy Benninger, a nurse practitioner and assistant professor at [employer] [facility] in [place]. 7. Laugh Out Loud  A good belly laugh doesn't just lighten the load mentally. It lowers cortisol, your body's stress hormone, and boosts brain chemicals called endorphins, which help your mood. Lighten up by tuning in to your favorite sitcom or video, reading   the comics, or chatting with someone who makes you smile. 8. Crank Up the Tunes  Research shows that listening to soothing music can lower blood pressure, heart rate, and anxiety. "Create a playlist of songs or nature sounds (the ocean, a bubbling brook, birds chirping), and allow your mind to focus on the different melodies, instruments, or singers in the piece," Benninger says. You also can blow off steam by rocking out to more upbeat tunes -- or singing at the top of your lungs! 9. Get Moving  You don't have to run in order to get a runner's high. All forms of exercise,  including yoga and walking, can ease depression and anxiety by helping the brain release feel-good chemicals and by giving your body a chance to practice dealing with stress. You can go for a quick walk around the block, take the stairs up and down a few flights, or do some stretching exercises like head rolls and shoulder shrugs. 10. Be Grateful  Keep a gratitude journal or several (one by your bed, one in your purse, and one at work) to help you remember all the things that are good in your life.  "Being grateful for your blessings cancels out negative thoughts and worries," says Joni Emmerling, a wellness coach in [place], [place].  Use these journals to savor good experiences like a child's smile, a sunshine-filled day, and good health. Don't forget to celebrate accomplishments like mastering a new task at work or a new hobby. When you start feeling stressed, spend a few minutes looking through your notes to remind yourself what really matters.   

## 2019-09-24 ENCOUNTER — Encounter: Payer: Self-pay | Admitting: Physician Assistant

## 2019-11-25 ENCOUNTER — Telehealth: Payer: Self-pay | Admitting: Physician Assistant

## 2019-11-25 ENCOUNTER — Telehealth (INDEPENDENT_AMBULATORY_CARE_PROVIDER_SITE_OTHER): Payer: 59 | Admitting: Physician Assistant

## 2019-11-25 ENCOUNTER — Telehealth: Payer: Self-pay

## 2019-11-25 DIAGNOSIS — J029 Acute pharyngitis, unspecified: Secondary | ICD-10-CM | POA: Diagnosis not present

## 2019-11-25 MED ORDER — AMOXICILLIN 875 MG PO TABS: 875.0000 mg | ORAL_TABLET | Freq: Two times a day (BID) | ORAL | 0 refills | Status: AC

## 2019-11-25 NOTE — Telephone Encounter (Signed)
Medication Refill - Medication: generic Ativan 0.5 mg  Has the patient contacted their pharmacy? No. (Agent: If no, request that the patient contact the pharmacy for the refill.) (Agent: If yes, when and what did the pharmacy advise?)  Preferred Pharmacy (with phone number or street name): [facility] [address]  Agent: Please be advised that RX refills may take up to 3 business days. We ask that you follow-up with your pharmacy.2

## 2019-11-25 NOTE — Telephone Encounter (Signed)
Patient needs OV. Mychart visit scheduled.

## 2019-11-25 NOTE — Telephone Encounter (Signed)
Patient contacted and was informed that prescriptions were available at the pharmacy. Patient advised to contact the pharmacy for refills of requested medication. Patient verbalized understanding.

## 2019-11-25 NOTE — Telephone Encounter (Signed)
Copied from CRM [id number]. Topic: General - Inquiry >> Nov 25, 2019  9:11 AM Daphine Deutscher D wrote: Reason for CRM: Pt called saying he has strep throat a lot and thinks he has it now.  He is asking that Victorino Dike send him an antibiotic to the pharmacy since he knows he can't come in.  [facility] [address]  CB#  [phone]

## 2019-11-25 NOTE — Progress Notes (Signed)
MyChart Video Visit    Virtual Visit via Video Note   This visit type was conducted due to national recommendations for restrictions regarding the COVID-19 Pandemic (e.g. social distancing) in an effort to limit this patient's exposure and mitigate transmission in our community. This patient is at least at moderate risk for complications without adequate follow up. This format is felt to be most appropriate for this patient at this time. Physical exam was limited by quality of the video and audio technology used for the visit.   Patient location: Home Provider location: Office   I discussed the limitations of evaluation and management by telemedicine and the availability of in person appointments. The patient expressed understanding and agreed to proceed.  Patient: Jason Stanton   DOB: [birth year]   34 y.o. Male  MRN: [id number] Visit Date: 11/25/2019  Today's healthcare provider: Trey Sailors, PA-C   Chief Complaint  Patient presents with  . Sore Throat  I,Porsha C McClurkin,acting as a scribe for Trey Sailors, PA-C.,have documented all relevant documentation on the behalf of Trey Sailors, PA-C,as directed by  Trey Sailors, PA-C while in the presence of Trey Sailors, PA-C.  Subjective    Sore Throat  This is a new problem. The current episode started in the past 7 days. The problem has been unchanged. There has been no fever. The pain is at a severity of 4/10. The pain is mild. Pertinent negatives include no congestion, coughing, ear pain, headaches, hoarse voice, shortness of breath, swollen glands or trouble swallowing. He has tried cool liquids, gargles and acetaminophen for the symptoms. The treatment provided mild relief.    Patient reports his wife is a chronic carrier of strep throat an he gets strep 5-6 times per year. Reports now his throat is very painful and he can see white spots on the back of it.     Medications: Outpatient Medications  Prior to Visit  Medication Sig  . amLODipine-benazepril (LOTREL) 5-10 MG capsule Take 1 capsule by mouth daily.  Marland Kitchen LORazepam (ATIVAN) 0.5 MG tablet Take 1 tablet (0.5 mg total) by mouth daily as needed for anxiety.   No facility-administered medications prior to visit.    Review of Systems  HENT: Positive for rhinorrhea and sore throat. Negative for congestion, ear pain, hoarse voice and trouble swallowing.   Respiratory: Negative for cough, shortness of breath and wheezing.   Neurological: Negative for headaches.      Objective    There were no vitals taken for this visit.   Physical Exam Constitutional:      Appearance: Normal appearance.  Pulmonary:     Effort: Pulmonary effort is normal. No respiratory distress.  Neurological:     Mental Status: He is alert.  Psychiatric:        Mood and Affect: Mood normal.        Behavior: Behavior normal.        Assessment & Plan    1. Sore throat  Recommend he come by our office today at 4:30 PM to be tested for COVID, flu and strep. Reports he won't get off of work until 5:00 PM. Treat empirically as below, needs to be further evaluated if not improving.   - amoxicillin (AMOXIL) 875 MG tablet; Take 1 tablet (875 mg total) by mouth 2 (two) times daily for 7 days.  Dispense: 14 tablet; Refill: 0   No follow-ups on file.     I discussed the assessment  and treatment plan with the patient. The patient was provided an opportunity to ask questions and all were answered. The patient agreed with the plan and demonstrated an understanding of the instructions.   The patient was advised to call back or seek an in-person evaluation if the symptoms worsen or if the condition fails to improve as anticipated.   ITrey Sailors, PA-C, have reviewed all documentation for this visit. The documentation on 11/25/19 for the exam, diagnosis, procedures, and orders are all accurate and complete.  The entirety of the information documented in  the History of Present Illness, Review of Systems and Physical Exam were personally obtained by me. Portions of this information were initially documented by [facility] and reviewed by me for thoroughness and accuracy.    Maryella Shivers [facility] [phone] (phone) [phone] (fax)  [facility]

## 2019-12-16 ENCOUNTER — Other Ambulatory Visit: Payer: Self-pay

## 2019-12-19 ENCOUNTER — Other Ambulatory Visit: Payer: 59

## 2019-12-19 ENCOUNTER — Other Ambulatory Visit: Payer: Self-pay

## 2019-12-19 DIAGNOSIS — Z302 Encounter for sterilization: Secondary | ICD-10-CM

## 2019-12-20 LAB — POST-VAS SPERM EVALUATION,QUAL: Volume: 1.5 mL

## 2019-12-22 ENCOUNTER — Telehealth: Payer: Self-pay | Admitting: *Deleted

## 2019-12-22 NOTE — Telephone Encounter (Signed)
-----   Message from Riki Altes, MD sent at 12/21/2019 11:08 AM EST ----- Semen analysis showed no sperm present.  Okay to use vasectomy as primary contraception.

## 2019-12-22 NOTE — Telephone Encounter (Signed)
Notified patient as instructed, patient pleased °

## 2020-01-26 ENCOUNTER — Encounter: Payer: Self-pay | Admitting: Physician Assistant

## 2020-01-26 ENCOUNTER — Telehealth (INDEPENDENT_AMBULATORY_CARE_PROVIDER_SITE_OTHER): Payer: 59 | Admitting: Physician Assistant

## 2020-01-26 DIAGNOSIS — M542 Cervicalgia: Secondary | ICD-10-CM

## 2020-01-26 MED ORDER — CYCLOBENZAPRINE HCL 10 MG PO TABS: 10.0000 mg | ORAL_TABLET | Freq: Three times a day (TID) | ORAL | 0 refills | Status: DC | PRN

## 2020-01-26 MED ORDER — METHYLPREDNISOLONE 4 MG PO TBPK
ORAL_TABLET | ORAL | 0 refills | Status: DC
Start: 2020-01-26 — End: 2020-05-05

## 2020-01-26 NOTE — Progress Notes (Signed)
MyChart Video Visit    Virtual Visit via Video Note   This visit type was conducted due to national recommendations for restrictions regarding the COVID-19 Pandemic (e.g. social distancing) in an effort to limit this patient's exposure and mitigate transmission in our community. This patient is at least at moderate risk for complications without adequate follow up. This format is felt to be most appropriate for this patient at this time. Physical exam was limited by quality of the video and audio technology used for the visit.   Interactive audio and video communications were attempted, although failed due to patient's inability to connect to video. Continued visit with audio only interaction with patient agreement.  Patient location: Home Provider location: [facility]  I discussed the limitations of evaluation and management by telemedicine and the availability of in person appointments. The patient expressed understanding and agreed to proceed.  Patient: Jason Stanton   DOB: 21-Sep-1985   35 y.o. Male  MRN: [id number] Visit Date: 01/26/2020  Today's healthcare provider: Margaretann Loveless, PA-C   No chief complaint on file.  Subjective    HPI  Neck Pain: Paitent complains of neck pain. Event that precipitate these symptoms: none known. Onset of symptoms 9 days ago, gradually worsening since that time. Current symptoms are stiffness and pain in neck when sneezing and when lifting objects above head. (aching, sharp and shooting in character; 5/10 in severity) and stiffness in neck\. Patient describes pain as migrating from neck to left shoulder. Patient denies pain in his back . Patient has had no prior neck problems.  Previous treatments include: icing, heating pad, as well as the medication: biofreeze. Have also tried aleve 440mg  two to three times daily. Denies any radicular symptoms into arms or hands.     Patient Active Problem List   Diagnosis Date Noted    Essential hypertension 02/08/2018   Past Medical History:  Diagnosis Date   Heart murmur    Family History  Problem Relation Age of Onset   Cancer Mother    Allergies  Allergen Reactions   Stadol [Butorphanol] Anaphylaxis    Medications: Outpatient Medications Prior to Visit  Medication Sig   amLODipine-benazepril (LOTREL) 5-10 MG capsule Take 1 capsule by mouth daily.   LORazepam (ATIVAN) 0.5 MG tablet Take 1 tablet (0.5 mg total) by mouth daily as needed for anxiety.   No facility-administered medications prior to visit.    Review of Systems  Constitutional: Negative.   Respiratory: Negative.   Cardiovascular: Negative.   Musculoskeletal: Positive for arthralgias, neck pain and neck stiffness. Negative for myalgias.  Neurological: Negative for weakness and numbness.    Last CBC Lab Results  Component Value Date   WBC 6.2 10/11/2018   HGB 16.2 10/11/2018   HCT 45.8 10/11/2018   MCV 89 10/11/2018   MCH 31.5 10/11/2018   RDW 11.7 10/11/2018   PLT 286 10/11/2018   Last metabolic panel Lab Results  Component Value Date   GLUCOSE 96 10/11/2018   NA 139 10/11/2018   K 4.5 10/11/2018   CL 101 10/11/2018   CO2 22 10/11/2018   BUN 11 10/11/2018   CREATININE 1.14 10/11/2018   GFRNONAA 85 10/11/2018   GFRAA 98 10/11/2018   CALCIUM 9.9 10/11/2018   PROT 7.7 10/11/2018   ALBUMIN 4.9 10/11/2018   LABGLOB 2.8 10/11/2018   AGRATIO 1.8 10/11/2018   BILITOT 0.5 10/11/2018   ALKPHOS 71 10/11/2018   AST 78 (H) 10/11/2018   ALT  101 (H) 10/11/2018   ANIONGAP 11 08/16/2016      Objective    There were no vitals taken for this visit. BP Readings from Last 3 Encounters:  09/12/19 (!) 160/90  08/08/19 (!) 131/79  07/17/19 127/88   Wt Readings from Last 3 Encounters:  09/12/19 228 lb (103.4 kg)  08/08/19 (!) 228 lb (103.4 kg)  07/17/19 231 lb 9.6 oz (105.1 kg)      Physical Exam     Assessment & Plan     1. Neck pain Worsening symptoms with no  concerning features for radiculopathy at this time. Suspect musculoskeletal at this time. Will treat with medrol and flexeril as below. Call if worsening or not improving.  - methylPREDNISolone (MEDROL) 4 MG TBPK tablet; 6 day taper; take as directed on package instructions  Dispense: 21 tablet; Refill: 0 - cyclobenzaprine (FLEXERIL) 10 MG tablet; Take 1 tablet (10 mg total) by mouth 3 (three) times daily as needed for muscle spasms.  Dispense: 30 tablet; Refill: 0   No follow-ups on file.     I discussed the assessment and treatment plan with the patient. The patient was provided an opportunity to ask questions and all were answered. The patient agreed with the plan and demonstrated an understanding of the instructions.   The patient was advised to call back or seek an in-person evaluation if the symptoms worsen or if the condition fails to improve as anticipated.  I provided 16 minutes of non-face-to-face time during this encounter.  Delmer Islam, PA-C, have reviewed all documentation for this visit. The documentation on 01/27/20 for the exam, diagnosis, procedures, and orders are all accurate and complete.  Reine Just [facility] [phone] (phone) [phone] (fax)  [facility]

## 2020-01-26 NOTE — Patient Instructions (Signed)

## 2020-01-27 ENCOUNTER — Encounter: Payer: Self-pay | Admitting: Physician Assistant

## 2020-02-13 ENCOUNTER — Other Ambulatory Visit: Payer: Self-pay | Admitting: Physician Assistant

## 2020-02-13 DIAGNOSIS — I10 Essential (primary) hypertension: Secondary | ICD-10-CM

## 2020-02-13 MED ORDER — AMLODIPINE BESY-BENAZEPRIL HCL 5-10 MG PO CAPS: 1.0000 | ORAL_CAPSULE | Freq: Every day | ORAL | 1 refills | Status: DC

## 2020-02-13 NOTE — Progress Notes (Signed)
Amlodipine- benazepril refilled.

## 2020-03-28 ENCOUNTER — Other Ambulatory Visit: Payer: Self-pay | Admitting: Physician Assistant

## 2020-03-28 DIAGNOSIS — F4323 Adjustment disorder with mixed anxiety and depressed mood: Secondary | ICD-10-CM

## 2020-03-28 NOTE — Telephone Encounter (Signed)
Requested medication (s) are due for refill today: yes  Requested medication (s) are on the active medication list: yes  Last refill:  09/18/19  Future visit scheduled: no  Notes to clinic:  med not delegated to NT to RF/   Requested Prescriptions  Pending Prescriptions Disp Refills   LORazepam (ATIVAN) 0.5 MG tablet [Pharmacy Med Name: LORazepam 0.5 MG Oral Tablet] 10 tablet 0    Sig: TAKE 1 TABLET BY MOUTH ONCE DAILY AS NEEDED FOR ANXIETY      Not Delegated - Psychiatry:  Anxiolytics/Hypnotics Failed - 03/28/2020 11:08 AM      Failed - This refill cannot be delegated      Failed - Urine Drug Screen completed in last 360 days      Failed - Valid encounter within last 6 months    Recent Outpatient Visits           2 months ago Neck pain   [facility] Joycelyn Man M, [place]   4 months ago Sore throat   [facility] Osvaldo Angst M, [place]   6 months ago Situational mixed anxiety and depressive disorder   [facility] [place], Alessandra Bevels, PA-C   8 months ago Encounter for vasectomy   [facility], Alessandra Bevels, [place]   1 year ago Essential hypertension   [facility] [place], [place], [place]

## 2020-03-29 NOTE — Telephone Encounter (Signed)
LOV 01/26/20 video acute  LRF 09/18/19

## 2020-04-27 ENCOUNTER — Other Ambulatory Visit: Payer: Self-pay | Admitting: Physician Assistant

## 2020-04-27 DIAGNOSIS — F4323 Adjustment disorder with mixed anxiety and depressed mood: Secondary | ICD-10-CM

## 2020-04-27 NOTE — Telephone Encounter (Signed)
Medication Refill - Medication: LORazepam (ATIVAN) 0.5 MG tablet   Pt is completely out, scheduled for next available. Needs enough to last until then, please advise   Has the patient contacted their pharmacy? No. (Agent: If no, request that the patient contact the pharmacy for the refill.) (Agent: If yes, when and what did the pharmacy advise?)  Preferred Pharmacy (with phone number or street name):  [facility] [address] [place] - [address]  [address] [place] [address]  Phone: [phone] Fax: [phone]     Agent: Please be advised that RX refills may take up to 3 business days. We ask that you follow-up with your pharmacy.

## 2020-04-27 NOTE — Telephone Encounter (Signed)
Requested medication (s) are due for refill today: yes  Requested medication (s) are on the active medication list: yes  Last refill:  03/29/20 #10 0 refill  Future visit scheduled: yes in 1 week  Notes to clinic:  not delegated per protocol, requesting enough medication until next appt. Patient is completely out of medication      Requested Prescriptions  Pending Prescriptions Disp Refills   LORazepam (ATIVAN) 0.5 MG tablet 10 tablet 0    Sig: Take 1 tablet (0.5 mg total) by mouth daily as needed for anxiety.      Not Delegated - Psychiatry:  Anxiolytics/Hypnotics Failed - 04/27/2020  5:59 PM      Failed - This refill cannot be delegated      Failed - Urine Drug Screen completed in last 360 days      Failed - Valid encounter within last 6 months    Recent Outpatient Visits           3 months ago Neck pain   [facility] Joycelyn Man M, [place]   5 months ago Sore throat   [facility] Osvaldo Angst M, [place]   7 months ago Situational mixed anxiety and depressive disorder   [employer] Rosezetta Schlatter, Al[place]andra Bevels, [place]   9 months ago Encounter for vasectomy   [facility], [place]essandra Bevels, [place]   1 year ago Essential hypertension   [facility] [place], [place]essandra Bevels, [place]       Future Appointments             In 1 week Flinchum, Eula Fried, FNP [facility], [facility]

## 2020-04-28 MED ORDER — LORAZEPAM 0.5 MG PO TABS
0.5000 mg | ORAL_TABLET | Freq: Every day | ORAL | 0 refills | Status: DC | PRN
Start: 2020-04-28 — End: 2020-05-05

## 2020-05-05 ENCOUNTER — Encounter: Payer: Self-pay | Admitting: Adult Health

## 2020-05-05 ENCOUNTER — Telehealth (INDEPENDENT_AMBULATORY_CARE_PROVIDER_SITE_OTHER): Payer: 59 | Admitting: Adult Health

## 2020-05-05 DIAGNOSIS — F4323 Adjustment disorder with mixed anxiety and depressed mood: Secondary | ICD-10-CM

## 2020-05-05 DIAGNOSIS — F419 Anxiety disorder, unspecified: Secondary | ICD-10-CM

## 2020-05-05 MED ORDER — LORAZEPAM 0.5 MG PO TABS: 0.5000 mg | ORAL_TABLET | Freq: Three times a day (TID) | ORAL | 0 refills | Status: DC | PRN

## 2020-05-05 MED ORDER — ESCITALOPRAM OXALATE 10 MG PO TABS: 10.0000 mg | ORAL_TABLET | Freq: Every day | ORAL | 0 refills | Status: DC

## 2020-05-05 NOTE — Progress Notes (Signed)
MyChart Video Visit    Virtual Visit via Video Note   This visit type was conducted due to national recommendations for restrictions regarding the COVID-19 Pandemic (e.g. social distancing) in an effort to limit this patient's exposure and mitigate transmission in our community. This patient is at least at moderate risk for complications without adequate follow up. This format is felt to be most appropriate for this patient at this time. Physical exam was limited by quality of the video and audio technology used for the visit.  Parties involved in visit as below:   Patient location: at home  Provider location: Provider: Provider's office at  [facility], [place].     I discussed the limitations of evaluation and management by telemedicine and the availability of in person appointments. The patient expressed understanding and agreed to proceed.  Patient: Jason Stanton   DOB: 21-Jun-1985   35 y.o. Male  MRN: [id number] Visit Date: 05/05/2020  Today's healthcare provider: Jairo Ben, FNP   Chief Complaint  Patient presents with  . Anxiety   Subjective    HPI  Anxiety, Follow-up  He was last seen for anxiety 7 months ago. Changes made at last visit include started patient on low dose Ativan 0.5. has had 10 tablets a month and has had life stressors increased. Taking ativan every other day or every three days due to increase stress. Denies Homicidal or suicidal ideations or intents.  Has stressful job and daughter with chronic illness.   Discussion by Daiva Nakayama PA was if using more than 10 times a month starting SSRI or SNRI.  He reports excellent compliance with treatment. He reports excellent tolerance of treatment. He is not having side effects.   He feels his anxiety is mild and Unchanged since last visit.  Symptoms: Yes chest pain No difficulty concentrating  No dizziness Yes fatigue  No feelings of losing control No insomnia   Yes irritable No palpitations  Yes panic attacks No racing thoughts  Yes shortness of breath No sweating  No tremors/shakes    GAD-7 Results GAD-7 Generalized Anxiety Disorder Screening Tool 12/02/2018  1. Feeling Nervous, Anxious, or on Edge 3  2. Not Being Able to Stop or Control Worrying 3  3. Worrying Too Much About Different Things 2  4. Trouble Relaxing 3  5. Being So Restless it's Hard To Sit Still 3  6. Becoming Easily Annoyed or Irritable 3  7. Feeling Afraid As If Something Awful Might Happen 3  Total GAD-7 Score 20  Difficulty At Work, Home, or Getting  Along With Others? Very difficult   Patient  denies any fever, body aches,chills, rash, chest pain, shortness of breath, nausea, vomiting, or diarrhea.  Denies dizziness, lightheadedness, pre syncopal or syncopal episodes.    PHQ-9 Scores PHQ9 SCORE ONLY 12/02/2018 02/08/2018 08/03/2017  PHQ-9 Total Score 11 2 1     ---------------------------------------------------------------------------------------------------  Patient Active Problem List   Diagnosis Date Noted  . Essential hypertension 02/08/2018   Past Medical History:  Diagnosis Date  . Heart murmur    Allergies  Allergen Reactions  . Stadol [Butorphanol] Anaphylaxis      Medications: Outpatient Medications Prior to Visit  Medication Sig  . amLODipine-benazepril (LOTREL) 5-10 MG capsule Take 1 capsule by mouth daily.  . cyclobenzaprine (FLEXERIL) 10 MG tablet Take 1 tablet (10 mg total) by mouth 3 (three) times daily as needed for muscle spasms.  . [DISCONTINUED] LORazepam (ATIVAN) 0.5 MG tablet Take 1 tablet (  0.5 mg total) by mouth daily as needed for anxiety.  . [DISCONTINUED] methylPREDNISolone (MEDROL) 4 MG TBPK tablet 6 day taper; take as directed on package instructions   No facility-administered medications prior to visit.    Review of Systems  Constitutional: Negative.   HENT: Negative.   Respiratory: Negative.   Cardiovascular:  Negative.   Gastrointestinal: Negative.   Genitourinary: Negative.   Musculoskeletal: Negative.   Skin: Negative.   Neurological: Negative.   Psychiatric/Behavioral: Negative for agitation, hallucinations and self-injury. The patient is nervous/anxious. The patient is not hyperactive.       Objective    There were no vitals taken for this visit.   Physical Exam    Patient is alert and oriented and responsive to questions Engages in conversation with provider. Speaks in full sentences without any pauses without any shortness of breath or distress.    Assessment & Plan     Anxiety - Plan: escitalopram (LEXAPRO) 10 MG tablet, LORazepam (ATIVAN) 0.5 MG tablet  Situational mixed anxiety and depressive disorder - Plan: escitalopram (LEXAPRO) 10 MG tablet, LORazepam (ATIVAN) 0.5 MG tablet    Meds ordered this encounter  Medications  . escitalopram (LEXAPRO) 10 MG tablet    Sig: Take 1 tablet (10 mg total) by mouth daily.    Dispense:  45 tablet    Refill:  0  . LORazepam (ATIVAN) 0.5 MG tablet    Sig: Take 1 tablet (0.5 mg total) by mouth every 8 (eight) hours as needed for anxiety.    Dispense:  45 tablet    Refill:  0    Discussed known black box warning for anti depression/ anxiety medication. Need to report any behavioral changes right, if any homicidal or suicidal thoughts or ideas seek medical attention right away. Call 911.    Goal is to reduce Ativan usage as Lexapro gains effectiveness. Not recommend long term benzodiazepines and not going to prescribe long term consider Atarax discussed. Return in about 1 month (around 06/04/2020), or if symptoms worsen or fail to improve, for at any time for any worsening symptoms, Go to Emergency room/ urgent care if worse.     I discussed the assessment and treatment plan with the patient. The patient was provided an opportunity to ask questions and all were answered. The patient agreed with the plan and demonstrated an  understanding of the instructions.   The patient was advised to call back or seek an in-person evaluation if the symptoms worsen or if the condition fails to improve as anticipated.   The entirety of the information documented in the History of Present Illness, Review of Systems and Physical Exam were personally obtained by me. Portions of this information were initially documented by the CMA and reviewed by me for thoroughness and accuracy.     Jairo Ben, FNP [facility] [phone] (phone) [phone] (fax)  [facility]

## 2020-05-05 NOTE — Patient Instructions (Addendum)
Escitalopram Tablets What is this medicine? ESCITALOPRAM (es sye TAL oh pram) is used to treat depression and certain types of anxiety. This medicine may be used for other purposes; ask your health care provider or pharmacist if you have questions. COMMON BRAND NAME(S): Lexapro What should I tell my health care provider before I take this medicine? They need to know if you have any of these conditions:  bipolar disorder or a family history of bipolar disorder  diabetes  glaucoma  heart disease  kidney or liver disease  receiving electroconvulsive therapy  seizures (convulsions)  suicidal thoughts, plans, or attempt by you or a family member  an unusual or allergic reaction to escitalopram, the related drug citalopram, other medicines, foods, dyes, or preservatives  pregnant or trying to become pregnant  breast-feeding How should I use this medicine? Take this medicine by mouth with a glass of water. Follow the directions on the prescription label. You can take it with or without food. If it upsets your stomach, take it with food. Take your medicine at regular intervals. Do not take it more often than directed. Do not stop taking this medicine suddenly except upon the advice of your doctor. Stopping this medicine too quickly may cause serious side effects or your condition may worsen. A special MedGuide will be given to you by the pharmacist with each prescription and refill. Be sure to read this information carefully each time. Talk to your pediatrician regarding the use of this medicine in children. Special care may be needed. Overdosage: If you think you have taken too much of this medicine contact a poison control center or emergency room at once. NOTE: This medicine is only for you. Do not share this medicine with others. What if I miss a dose? If you miss a dose, take it as soon as you can. If it is almost time for your next dose, take only that dose. Do not take double or  extra doses. What may interact with this medicine? Do not take this medicine with any of the following medications:  certain medicines for fungal infections like fluconazole, itraconazole, ketoconazole, posaconazole, voriconazole  cisapride  citalopram  dronedarone  linezolid  MAOIs like Carbex, Eldepryl, Marplan, Nardil, and Parnate  methylene blue (injected into a vein)  pimozide  thioridazine This medicine may also interact with the following medications:  alcohol  amphetamines  aspirin and aspirin-like medicines  carbamazepine  certain medicines for depression, anxiety, or psychotic disturbances  certain medicines for migraine headache like almotriptan, eletriptan, frovatriptan, naratriptan, rizatriptan, sumatriptan, zolmitriptan  certain medicines for sleep  certain medicines that treat or prevent blood clots like warfarin, enoxaparin, dalteparin  cimetidine  diuretics  dofetilide  fentanyl  furazolidone  isoniazid  lithium  metoprolol  NSAIDs, medicines for pain and inflammation, like ibuprofen or naproxen  other medicines that prolong the QT interval (cause an abnormal heart rhythm)  procarbazine  rasagiline  supplements like St. John's wort, kava kava, valerian  tramadol  tryptophan  ziprasidone This list may not describe all possible interactions. Give your health care provider a list of all the medicines, herbs, non-prescription drugs, or dietary supplements you use. Also tell them if you smoke, drink alcohol, or use illegal drugs. Some items may interact with your medicine. What should I watch for while using this medicine? Tell your doctor if your symptoms do not get better or if they get worse. Visit your doctor or health care professional for regular checks on your progress. Because it may   take several weeks to see the full effects of this medicine, it is important to continue your treatment as prescribed by your doctor. Patients  and their families should watch out for new or worsening thoughts of suicide or depression. Also watch out for sudden changes in feelings such as feeling anxious, agitated, panicky, irritable, hostile, aggressive, impulsive, severely restless, overly excited and hyperactive, or not being able to sleep. If this happens, especially at the beginning of treatment or after a change in dose, call your health care professional. Bonita Quin may get drowsy or dizzy. Do not drive, use machinery, or do anything that needs mental alertness until you know how this medicine affects you. Do not stand or sit up quickly, especially if you are an older patient. This reduces the risk of dizzy or fainting spells. Alcohol may interfere with the effect of this medicine. Avoid alcoholic drinks. Your mouth may get dry. Chewing sugarless gum or sucking hard candy, and drinking plenty of water may help. Contact your doctor if the problem does not go away or is severe. What side effects may I notice from receiving this medicine? Side effects that you should report to your doctor or health care professional as soon as possible:  allergic reactions like skin rash, itching or hives, swelling of the face, lips, or tongue  anxious  black, tarry stools  changes in vision  confusion  elevated mood, decreased need for sleep, racing thoughts, impulsive behavior  eye pain  fast, irregular heartbeat  feeling faint or lightheaded, falls  feeling agitated, angry, or irritable  hallucination, loss of contact with reality  loss of balance or coordination  loss of memory  painful or prolonged erections  restlessness, pacing, inability to keep still  seizures  stiff muscles  suicidal thoughts or other mood changes  trouble sleeping  unusual bleeding or bruising  unusually weak or tired  vomiting Side effects that usually do not require medical attention (report to your doctor or health care professional if they  continue or are bothersome):  changes in appetite  change in sex drive or performance  headache  increased sweating  indigestion, nausea  tremors This list may not describe all possible side effects. Call your doctor for medical advice about side effects. You may report side effects to FDA at 1-800-FDA-1088. Where should I keep my medicine? Keep out of reach of children. Store at room temperature between 15 and 30 degrees C (59 and 86 degrees F). Throw away any unused medicine after the expiration date. NOTE: This sheet is a summary. It may not cover all possible information. If you have questions about this medicine, talk to your doctor, pharmacist, or health care provider.  2021 Elsevier/Gold Standard (2019-11-24 09:53:34) Lorazepam tablets What is this medicine? Managing Anxiety, Adult After being diagnosed with an anxiety disorder, you may be relieved to know why you have felt or behaved a certain way. You may also feel overwhelmed about the treatment ahead and what it will mean for your life. With care and support, you can manage this condition and recover from it. How to manage lifestyle changes Managing stress and anxiety Stress is your body's reaction to life changes and events, both good and bad. Most stress will last just a few hours, but stress can be ongoing and can lead to more than just stress. Although stress can play a major role in anxiety, it is not the same as anxiety. Stress is usually caused by something external, such as a deadline, test,  or competition. Stress normally passes after the triggering event has ended.  Anxiety is caused by something internal, such as imagining a terrible outcome or worrying that something will go wrong that will devastate you. Anxiety often does not go away even after the triggering event is over, and it can become long-term (chronic) worry. It is important to understand the differences between stress and anxiety and to manage your stress  effectively so that it does not lead to an anxious response. Talk with your health care provider or a counselor to learn more about reducing anxiety and stress. He or she may suggest tension reduction techniques, such as:  Music therapy. This can include creating or listening to music that you enjoy and that inspires you.  Mindfulness-based meditation. This involves being aware of your normal breaths while not trying to control your breathing. It can be done while sitting or walking.  Centering prayer. This involves focusing on a word, phrase, or sacred image that means something to you and brings you peace.  Deep breathing. To do this, expand your stomach and inhale slowly through your nose. Hold your breath for 3-5 seconds. Then exhale slowly, letting your stomach muscles relax.  Self-talk. This involves identifying thought patterns that lead to anxiety reactions and changing those patterns.  Muscle relaxation. This involves tensing muscles and then relaxing them. Choose a tension reduction technique that suits your lifestyle and personality. These techniques take time and practice. Set aside 5-15 minutes a day to do them. Therapists can offer counseling and training in these techniques. The training to help with anxiety may be covered by some insurance plans. Other things you can do to manage stress and anxiety include:  Keeping a stress/anxiety diary. This can help you learn what triggers your reaction and then learn ways to manage your response.  Thinking about how you react to certain situations. You may not be able to control everything, but you can control your response.  Making time for activities that help you relax and not feeling guilty about spending your time in this way.  Visual imagery and yoga can help you stay calm and relax.   Medicines Medicines can help ease symptoms. Medicines for anxiety include:  Anti-anxiety drugs.  Antidepressants. Medicines are often used as a  primary treatment for anxiety disorder. Medicines will be prescribed by a health care provider. When used together, medicines, psychotherapy, and tension reduction techniques may be the most effective treatment. Relationships Relationships can play a big part in helping you recover. Try to spend more time connecting with trusted friends and family members. Consider going to couples counseling, taking family education classes, or going to family therapy. Therapy can help you and others better understand your condition. How to recognize changes in your anxiety Everyone responds differently to treatment for anxiety. Recovery from anxiety happens when symptoms decrease and stop interfering with your daily activities at home or work. This may mean that you will start to:  Have better concentration and focus. Worry will interfere less in your daily thinking.  Sleep better.  Be less irritable.  Have more energy.  Have improved memory. It is important to recognize when your condition is getting worse. Contact your health care provider if your symptoms interfere with home or work and you feel like your condition is not improving. Follow these instructions at home: Activity  Exercise. Most adults should do the following: ? Exercise for at least 150 minutes each week. The exercise should increase your heart rate  and make you sweat (moderate-intensity exercise). ? Strengthening exercises at least twice a week.  Get the right amount and quality of sleep. Most adults need 7-9 hours of sleep each night. Lifestyle  Eat a healthy diet that includes plenty of vegetables, fruits, whole grains, low-fat dairy products, and lean protein. Do not eat a lot of foods that are high in solid fats, added sugars, or salt.  Make choices that simplify your life.  Do not use any products that contain nicotine or tobacco, such as cigarettes, e-cigarettes, and chewing tobacco. If you need help quitting, ask your health  care provider.  Avoid caffeine, alcohol, and certain over-the-counter cold medicines. These may make you feel worse. Ask your pharmacist which medicines to avoid.   General instructions  Take over-the-counter and prescription medicines only as told by your health care provider.  Keep all follow-up visits as told by your health care provider. This is important. Where to find support You can get help and support from these sources:  Self-help groups.  Online and [employer].  A trusted spiritual leader.  Couples counseling.  Family education classes.  Family therapy. Where to find more information You may find that joining a support group helps you deal with your anxiety. The following sources can help you locate counselors or support groups near you:  Mental Health America: www.mentalhealthamerica.net  [employer]xiety [employer] of [place] (ADAA): ProgramCam.de  [employer]ness (NAMI): www.nami.org Contact a health care provider if you:  Have a hard time staying focused or finishing daily tasks.  Spend many hours a day feeling worried about everyday life.  Become exhausted by worry.  Start to have headaches, feel tense, or have nausea.  Urinate more than normal.  Have diarrhea. Get help right away if you have:  A racing heart and shortness of breath.  Thoughts of hurting yourself or others. If you ever feel like you may hurt yourself or others, or have thoughts about taking your own life, get help right away. You can go to your nearest emergency department or call:  Your local emergency services (911 in the [place]  A suicide crisis helpline, such as the National Suicide Prevention Lifeline at 731-561-0530. This is open 24 hours a day. Summary  Taking steps to learn and use tension reduction techniques can help calm you and help prevent triggering an anxiety reaction.  When used together, medicines, psychotherapy, and  tension reduction techniques may be the most effective treatment.  Family, friends, and partners can play a big part in helping you recover from an anxiety disorder. This information is not intended to replace advice given to you by your health care provider. Make sure you discuss any questions you have with your health care provider. Document Revised: 06/04/2018 Document Reviewed: 06/04/2018 Elsevier Patient Education  2021 Elsevier Inc.  LORAZEPAM (lor A ze pam) is a benzodiazepine. It is used to treat anxiety. This medicine may be used for other purposes; ask your health care provider or pharmacist if you have questions. COMMON BRAND NAME(S): Ativan What should I tell my health care provider before I take this medicine? They need to know if you have any of these conditions:  glaucoma  history of drug or alcohol abuse problem  kidney disease  liver disease  lung or breathing disease, like asthma  mental illness  myasthenia gravis  Parkinson's disease  suicidal thoughts, plans, or attempt; a previous suicide attempt by you or a family member  an unusual or  allergic reaction to lorazepam, other medicines, foods, dyes, or preservatives  pregnant or trying to get pregnant  breast-feeding How should I use this medicine? Take this medicine by mouth with a glass of water. Follow the directions on the prescription label. Take your medicine at regular intervals. Do not take it more often than directed. Do not stop taking except on your doctor's advice. A special MedGuide will be given to you by the pharmacist with each prescription and refill. Be sure to read this information carefully each time. Talk to your pediatrician regarding the use of this medicine in children. While this drug may be used in children as young as 12 years for selected conditions, precautions do apply. Overdosage: If you think you have taken too much of this medicine contact a poison control center or  emergency room at once. NOTE: This medicine is only for you. Do not share this medicine with others. What if I miss a dose? If you miss a dose, take it as soon as you can. If it is almost time for your next dose, take only that dose. Do not take double or extra doses. What may interact with this medicine? Do not take this medicine with any of the following medications:  narcotic medicines for cough  sodium oxybate This medicine may also interact with the following medications:  alcohol  antihistamines for allergy, cough and cold  certain medicines for anxiety or sleep  certain medicines for depression, like amitriptyline, fluoxetine, sertraline  certain medicines for seizures like carbamazepine, phenobarbital, phenytoin, primidone  general anesthetics like lidocaine, pramoxine, tetracaine  MAOIs like Carbex, Eldepryl, Marplan, Nardil, and Parnate  medicines that relax muscles for surgery  narcotic medicines for pain  phenothiazines like chlorpromazine, mesoridazine, prochlorperazine, thioridazine This list may not describe all possible interactions. Give your health care provider a list of all the medicines, herbs, non-prescription drugs, or dietary supplements you use. Also tell them if you smoke, drink alcohol, or use illegal drugs. Some items may interact with your medicine. What should I watch for while using this medicine? Tell your doctor or health care professional if your symptoms do not start to get better or if they get worse. Do not stop taking except on your doctor's advice. You may develop a severe reaction. Your doctor will tell you how much medicine to take. You may get drowsy or dizzy. Do not drive, use machinery, or do anything that needs mental alertness until you know how this medicine affects you. To reduce the risk of dizzy and fainting spells, do not stand or sit up quickly, especially if you are an older patient. Alcohol may increase dizziness and drowsiness.  Avoid alcoholic drinks. If you are taking another medicine that also causes drowsiness, you may have more side effects. Give your health care provider a list of all medicines you use. Your doctor will tell you how much medicine to take. Do not take more medicine than directed. Call emergency for help if you have problems breathing or unusual sleepiness. What side effects may I notice from receiving this medicine? Side effects that you should report to your doctor or health care professional as soon as possible:  allergic reactions like skin rash, itching or hives, swelling of the face, lips, or tongue  breathing problems  confusion  loss of balance or coordination  signs and symptoms of low blood pressure like dizziness; feeling faint or lightheaded, falls; unusually weak or tired  suicidal thoughts or other mood changes Side effects that  usually do not require medical attention (report to your doctor or health care professional if they continue or are bothersome):  dizziness  headache  nausea, vomiting  tiredness This list may not describe all possible side effects. Call your doctor for medical advice about side effects. You may report side effects to FDA at 1-800-FDA-1088. Where should I keep my medicine? Keep out of the reach of children. This medicine can be abused. Keep your medicine in a safe place to protect it from theft. Do not share this medicine with anyone. Selling or giving away this medicine is dangerous and against the law. This medicine may cause accidental overdose and death if taken by other adults, children, or pets. Mix any unused medicine with a substance like cat litter or coffee grounds. Then throw the medicine away in a sealed container like a sealed bag or a coffee can with a lid. Do not use the medicine after the expiration date. Store at room temperature between 20 and 25 degrees C (68 and 77 degrees F). Protect from light. Keep container tightly closed. NOTE:  This sheet is a summary. It may not cover all possible information. If you have questions about this medicine, talk to your doctor, pharmacist, or health care provider.  2021 Elsevier/Gold Standard (2014-10-01 15:54:27) Escitalopram Tablets What is this medicine? ESCITALOPRAM (es sye TAL oh pram) is used to treat depression and certain types of anxiety. This medicine may be used for other purposes; ask your health care provider or pharmacist if you have questions. COMMON BRAND NAME(S): Lexapro What should I tell my health care provider before I take this medicine? They need to know if you have any of these conditions:  bipolar disorder or a family history of bipolar disorder  diabetes  glaucoma  heart disease  kidney or liver disease  receiving electroconvulsive therapy  seizures (convulsions)  suicidal thoughts, plans, or attempt by you or a family member  an unusual or allergic reaction to escitalopram, the related drug citalopram, other medicines, foods, dyes, or preservatives  pregnant or trying to become pregnant  breast-feeding How should I use this medicine? Take this medicine by mouth with a glass of water. Follow the directions on the prescription label. You can take it with or without food. If it upsets your stomach, take it with food. Take your medicine at regular intervals. Do not take it more often than directed. Do not stop taking this medicine suddenly except upon the advice of your doctor. Stopping this medicine too quickly may cause serious side effects or your condition may worsen. A special MedGuide will be given to you by the pharmacist with each prescription and refill. Be sure to read this information carefully each time. Talk to your pediatrician regarding the use of this medicine in children. Special care may be needed. Overdosage: If you think you have taken too much of this medicine contact a poison control center or emergency room at once. NOTE: This  medicine is only for you. Do not share this medicine with others. What if I miss a dose? If you miss a dose, take it as soon as you can. If it is almost time for your next dose, take only that dose. Do not take double or extra doses. What may interact with this medicine? Do not take this medicine with any of the following medications:  certain medicines for fungal infections like fluconazole, itraconazole, ketoconazole, posaconazole, voriconazole  cisapride  citalopram  dronedarone  linezolid  MAOIs like Carbex, Eldepryl,  Marplan, Nardil, and Parnate  methylene blue (injected into a vein)  pimozide  thioridazine This medicine may also interact with the following medications:  alcohol  amphetamines  aspirin and aspirin-like medicines  carbamazepine  certain medicines for depression, anxiety, or psychotic disturbances  certain medicines for migraine headache like almotriptan, eletriptan, frovatriptan, naratriptan, rizatriptan, sumatriptan, zolmitriptan  certain medicines for sleep  certain medicines that treat or prevent blood clots like warfarin, enoxaparin, dalteparin  cimetidine  diuretics  dofetilide  fentanyl  furazolidone  isoniazid  lithium  metoprolol  NSAIDs, medicines for pain and inflammation, like ibuprofen or naproxen  other medicines that prolong the QT interval (cause an abnormal heart rhythm)  procarbazine  rasagiline  supplements like St. John's wort, kava kava, valerian  tramadol  tryptophan  ziprasidone This list may not describe all possible interactions. Give your health care provider a list of all the medicines, herbs, non-prescription drugs, or dietary supplements you use. Also tell them if you smoke, drink alcohol, or use illegal drugs. Some items may interact with your medicine. What should I watch for while using this medicine? Tell your doctor if your symptoms do not get better or if they get worse. Visit your doctor  or health care professional for regular checks on your progress. Because it may take several weeks to see the full effects of this medicine, it is important to continue your treatment as prescribed by your doctor. Patients and their families should watch out for new or worsening thoughts of suicide or depression. Also watch out for sudden changes in feelings such as feeling anxious, agitated, panicky, irritable, hostile, aggressive, impulsive, severely restless, overly excited and hyperactive, or not being able to sleep. If this happens, especially at the beginning of treatment or after a change in dose, call your health care professional. Bonita Quin may get drowsy or dizzy. Do not drive, use machinery, or do anything that needs mental alertness until you know how this medicine affects you. Do not stand or sit up quickly, especially if you are an older patient. This reduces the risk of dizzy or fainting spells. Alcohol may interfere with the effect of this medicine. Avoid alcoholic drinks. Your mouth may get dry. Chewing sugarless gum or sucking hard candy, and drinking plenty of water may help. Contact your doctor if the problem does not go away or is severe. What side effects may I notice from receiving this medicine? Side effects that you should report to your doctor or health care professional as soon as possible:  allergic reactions like skin rash, itching or hives, swelling of the face, lips, or tongue  anxious  black, tarry stools  changes in vision  confusion  elevated mood, decreased need for sleep, racing thoughts, impulsive behavior  eye pain  fast, irregular heartbeat  feeling faint or lightheaded, falls  feeling agitated, angry, or irritable  hallucination, loss of contact with reality  loss of balance or coordination  loss of memory  painful or prolonged erections  restlessness, pacing, inability to keep still  seizures  stiff muscles  suicidal thoughts or other mood  changes  trouble sleeping  unusual bleeding or bruising  unusually weak or tired  vomiting Side effects that usually do not require medical attention (report to your doctor or health care professional if they continue or are bothersome):  changes in appetite  change in sex drive or performance  headache  increased sweating  indigestion, nausea  tremors This list may not describe all possible side effects. Call  your doctor for medical advice about side effects. You may report side effects to FDA at 1-800-FDA-1088. Where should I keep my medicine? Keep out of reach of children. Store at room temperature between 15 and 30 degrees C (59 and 86 degrees F). Throw away any unused medicine after the expiration date. NOTE: This sheet is a summary. It may not cover all possible information. If you have questions about this medicine, talk to your doctor, pharmacist, or health care provider.  2021 Elsevier/Gold Standard (2019-11-24 09:53:34)

## 2020-05-25 ENCOUNTER — Encounter: Payer: Self-pay | Admitting: Physician Assistant

## 2020-09-07 ENCOUNTER — Encounter: Payer: Self-pay | Admitting: Family Medicine

## 2020-09-07 ENCOUNTER — Other Ambulatory Visit: Payer: Self-pay

## 2020-09-07 ENCOUNTER — Ambulatory Visit: Payer: 59 | Admitting: Family Medicine

## 2020-09-07 VITALS — BP 123/89 | HR 95 | Temp 98.5°F | Wt 238.0 lb

## 2020-09-07 DIAGNOSIS — H1033 Unspecified acute conjunctivitis, bilateral: Secondary | ICD-10-CM | POA: Diagnosis not present

## 2020-09-07 NOTE — Progress Notes (Signed)
      Established patient visit   Patient: Jason Stanton   DOB: Jan 25, 1985   35 y.o. Male  MRN: [id number] Visit Date: 09/07/2020  Today's healthcare provider: Mila Merry, MD   Chief Complaint  Patient presents with   Conjunctivitis   Subjective  -------------------------------------------------------------------------------------------------------------------- Conjunctivitis  The current episode started more than 2 weeks ago. The problem has been gradually worsening. Nothing relieves the symptoms. Associated symptoms include eye itching, congestion, headaches, sore throat, cough, eye discharge and eye redness. Pertinent negatives include no photophobia, no ear discharge, no ear pain, no rhinorrhea, no stridor, no wheezing and no eye pain. Both eyes are affected.   It started in his right eye which did improve a bit, but spread to his left eye.  His mother had a prescription for ciprofloxacin which he took but did not tolerate. He also had a leftover prescription for polymycin drops which did not help. Also tried OTC Red Eye which did not help.   He had Tele[place] prescription recently and had tobramycin drops called in which he has not started yet.     Medications: Outpatient Medications Prior to Visit  Medication Sig   amLODipine-benazepril (LOTREL) 5-10 MG capsule Take 1 capsule by mouth daily.   cyclobenzaprine (FLEXERIL) 10 MG tablet Take 1 tablet (10 mg total) by mouth 3 (three) times daily as needed for muscle spasms.   escitalopram (LEXAPRO) 10 MG tablet Take 1 tablet (10 mg total) by mouth daily.   LORazepam (ATIVAN) 0.5 MG tablet Take 1 tablet (0.5 mg total) by mouth every 8 (eight) hours as needed for anxiety.   No facility-administered medications prior to visit.    Review of Systems  Constitutional: Negative.   HENT:  Positive for congestion, sinus pressure, sinus pain and sore throat. Negative for ear discharge, ear pain, postnasal drip, rhinorrhea, sneezing,  tinnitus, trouble swallowing and voice change.   Eyes:  Positive for discharge, redness and itching. Negative for photophobia, pain and visual disturbance.  Respiratory:  Positive for cough. Negative for apnea, choking, chest tightness, shortness of breath, wheezing and stridor.   Neurological:  Positive for headaches. Negative for dizziness and light-headedness.      Objective  -------------------------------------------------------------------------------------------------------------------- BP 123/89 (BP Location: Right Arm, Patient Position: Sitting, Cuff Size: Large)   Pulse 95   Temp 98.5 F (36.9 C) (Oral)   Wt 238 lb (108 kg)   SpO2 100%   BMI 32.28 kg/m     Physical Exam  Both conjunctivae and inner eyelids injects with small amount clear yellow drainage.     Assessment & Plan  ---------------------------------------------------------------------------------------------------------------------- 1. Acute bacterial conjunctivitis of both eyes Advised to start tobramycin drops recently prescribed via telemedicine visit ASAP. Work excused next two days. Advised need to see ophthalmologist if not rapidly improving with tobramycin.         The entirety of the information documented in the History of Present Illness, Review of Systems and Physical Exam were personally obtained by me. Portions of this information were initially documented by the CMA and reviewed by me for thoroughness and accuracy.     Mila Merry, MD  [facility] [phone] (phone) [phone] (fax)  [facility]

## 2020-09-07 NOTE — Patient Instructions (Signed)
.   Please review the attached list of medications and notify my office if there are any errors.   . Please bring all of your medications to every appointment so we can make sure that our medication list is the same as yours.   

## 2020-10-12 ENCOUNTER — Other Ambulatory Visit: Payer: Self-pay | Admitting: Family Medicine

## 2020-10-12 DIAGNOSIS — I10 Essential (primary) hypertension: Secondary | ICD-10-CM

## 2020-10-12 MED ORDER — AMLODIPINE BESY-BENAZEPRIL HCL 5-10 MG PO CAPS: 1.0000 | ORAL_CAPSULE | Freq: Every day | ORAL | 0 refills | Status: DC

## 2020-10-12 NOTE — Telephone Encounter (Signed)
Medication Refill - Medication: amlodipine benazepril   Has the patient contacted their pharmacy? Yes.   Pt states that the pharmacy told him to contact PCP due to no refills. He states that he took his last one this morning. Please advise . (Agent: If no, request that the patient contact the pharmacy for the refill.) (Agent: If yes, when and what did the pharmacy advise?)  Preferred Pharmacy (with phone number or street name):  [facility] [address], [place] - [address]  [address] [place] [address]  Phone: [phone] Fax: [phone]  Hours: Not open 24 hours   Has the patient been seen for an appointment in the last year OR does the patient have an upcoming appointment? Yes.    Agent: Please be advised that RX refills may take up to 3 business days. We ask that you follow-up with your pharmacy.

## 2020-11-09 ENCOUNTER — Other Ambulatory Visit: Payer: Self-pay | Admitting: Family Medicine

## 2020-11-09 DIAGNOSIS — F419 Anxiety disorder, unspecified: Secondary | ICD-10-CM

## 2020-11-09 DIAGNOSIS — F4323 Adjustment disorder with mixed anxiety and depressed mood: Secondary | ICD-10-CM

## 2020-11-09 NOTE — Telephone Encounter (Signed)
Requested medications are due for refill today.  yes  Requested medications are on the active medications list.  yes  Last refill. 05/05/2020  Future visit scheduled.   no  Notes to clinic.  Medication not delegated. 

## 2020-11-09 NOTE — Telephone Encounter (Signed)
Copied from CRM [id number]. Topic: Quick Communication - Rx Refill/Question >> Nov 09, 2020  1:12 PM Izora Ribas, [place] A wrote: Medication: LORazepam (ATIVAN) 0.5 MG tablet [[id number]]   Has the patient contacted their pharmacy? No. The patient has been unable to successfully reach their pharmacy.  (Agent: If no, request that the patient contact the pharmacy for the refill. If patient does not wish to contact the pharmacy document the reason why and proceed with request.) (Agent: If yes, when and what did the pharmacy advise?)  Preferred Pharmacy (with phone number or street name): [facility] [address] [place], [place] - [address]  Phone:  [phone] Fax:  [phone]  Has the patient been seen for an appointment in the last year OR does the patient have an upcoming appointment? Yes.    Agent: Please be advised that RX refills may take up to 3 business days. We ask that you follow-up with your pharmacy.

## 2020-11-10 NOTE — Telephone Encounter (Signed)
LOV: 09/07/2020   Thanks,   -Vernona Rieger

## 2020-11-18 NOTE — Telephone Encounter (Signed)
Pt called saying he needs to get his Lorazepam filled ASAP.  He is completely out .  [facility] [address]  CB#  [phone]

## 2020-11-24 ENCOUNTER — Other Ambulatory Visit: Payer: Self-pay | Admitting: Physician Assistant

## 2020-11-24 ENCOUNTER — Telehealth: Payer: Self-pay

## 2020-11-24 DIAGNOSIS — F4323 Adjustment disorder with mixed anxiety and depressed mood: Secondary | ICD-10-CM

## 2020-11-24 DIAGNOSIS — F419 Anxiety disorder, unspecified: Secondary | ICD-10-CM

## 2020-11-24 MED ORDER — LORAZEPAM 0.5 MG PO TABS: 0.5000 mg | ORAL_TABLET | Freq: Three times a day (TID) | ORAL | 0 refills | Status: AC | PRN

## 2020-11-24 NOTE — Telephone Encounter (Signed)
Mychart message sent to patient.

## 2020-11-24 NOTE — Telephone Encounter (Signed)
Copied from CRM [id number]. Topic: Appointment Scheduling - Scheduling Inquiry for Clinic >> Nov 24, 2020  9:44 AM Gaetana Michaelis A wrote: Reason for CRM: The patient would like to be scheduled for their medication follow up   The patient would like to continue to receive their LORazepam (ATIVAN) 0.5 MG tablet [[id number]]  prescription   Please contact further

## 2020-11-24 NOTE — Telephone Encounter (Signed)
Patient is asking for enough Lorazepam until he can be seen on 12/03/20 with Lillia Abed.  [facility] [address]

## 2020-11-24 NOTE — Progress Notes (Signed)
Pt requests ativan until his appointment Last fill was 04/2020 Will send in and discuss at appointment

## 2020-11-24 NOTE — Telephone Encounter (Signed)
Last refill: 05/05/2020 #45 with no refills  Next office visit: 12/03/2020 with [name] Last office visit: 05/05/2020

## 2020-12-01 ENCOUNTER — Ambulatory Visit: Payer: 59 | Admitting: Physician Assistant

## 2020-12-03 ENCOUNTER — Ambulatory Visit (INDEPENDENT_AMBULATORY_CARE_PROVIDER_SITE_OTHER): Payer: 59 | Admitting: Physician Assistant

## 2020-12-03 ENCOUNTER — Other Ambulatory Visit: Payer: Self-pay

## 2020-12-03 ENCOUNTER — Encounter: Payer: Self-pay | Admitting: Physician Assistant

## 2020-12-03 VITALS — BP 122/84 | HR 90 | Ht 72.0 in | Wt 246.2 lb

## 2020-12-03 DIAGNOSIS — I10 Essential (primary) hypertension: Secondary | ICD-10-CM | POA: Diagnosis not present

## 2020-12-03 DIAGNOSIS — Z0183 Encounter for blood typing: Secondary | ICD-10-CM

## 2020-12-03 DIAGNOSIS — F410 Panic disorder [episodic paroxysmal anxiety] without agoraphobia: Secondary | ICD-10-CM

## 2020-12-03 MED ORDER — AMLODIPINE BESY-BENAZEPRIL HCL 5-10 MG PO CAPS
1.0000 | ORAL_CAPSULE | Freq: Every day | ORAL | 2 refills | Status: DC
Start: 2020-12-03 — End: 2021-09-27

## 2020-12-03 NOTE — Patient Instructions (Addendum)
[employer] [address], [place], [place] [address] [phone]  [email or web address] [address] [place], [place] [address] [phone]

## 2020-12-03 NOTE — Assessment & Plan Note (Signed)
Manages with Ativan currently, one 30 pill bottle will last much more than a month.  We discussed lexapro and SSRIs in general, their MOA and purpose to decrease incidence of panic attacks. I advised if he feels they occur more often or become more debilitating, to discuss again potentially starting another SSRI.   We will f/u 6 mo but will monitor ativan usage w/ refill requests. Encouraged him to keep going to meetings at church.

## 2020-12-03 NOTE — Progress Notes (Signed)
Established patient visit   Patient: Jason Stanton   DOB: 01/18/1985   35 y.o. Male  MRN: [id number] Visit Date: 12/03/2020  Today's healthcare provider: [name], PA-C   Chief Complaint  Patient presents with   Follow-up   Anxiety   Medication Refill   Subjective    HPI  -Has had a lot of family stress that triggers anxiety Anxiety, Follow-up  He was last seen for anxiety 7 months ago. (Video Visit) Changes made at last visit include escitalopram (LEXAPRO) 10 MG tablet, LORazepam (ATIVAN) 0.5 MG tablet.   He reports good compliance with treatment. -- hes stopped taking the Lexapro.Marland Kitchen  He reports excellent tolerance of treatment. He is not having side effects. none  He feels his anxiety is mild and  depends on the day and situation  since last visit. He primarily deals with panic attacks, he feels a tight/pain sensation in his chest, or will feel overwhelmed. Happens more when he slows down/relaxes. Sometimes takes Ativan 3 times a week, sometimes twice a month.   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  He states he stopped the lexapro as he doesn't want to be on a medication long term, this is a short term issue. He primarily deals with anxiety surrounding his children's health. But does state that symptoms of anxiety/panic started 4 years ago when his mother was diagnosed with cancer.  Reports drinking alcohol maybe twice a week, Fridays and Saturdays, 1-2 drinks, prefers whiskey.  Symptoms: Yes chest pain No difficulty concentrating  Yes dizziness Yes fatigue  No feelings of losing control No insomnia  No irritable Yes palpitations  Yes panic attacks No racing thoughts  Yes shortness of breath Yes sweating  No tremors/shakes    GAD-7 Results GAD-7 Generalized Anxiety Disorder Screening Tool 12/03/2020 12/02/2018  1. Feeling Nervous, Anxious, or on Edge 2 3  2. Not Being Able to Stop or Control  Worrying 2 3  3. Worrying Too Much About Different Things 2 2  4. Trouble Relaxing 1 3  5. Being So Restless it's Hard To Sit Still 0 3  6. Becoming Easily Annoyed or Irritable 0 3  7. Feeling Afraid As If Something Awful Might Happen 1 3  Total GAD-7 Score 8 20  Difficulty At Work, Home, or Getting  Along With Others? Somewhat difficult Very difficult    PHQ-9 Scores PHQ9 SCORE ONLY 12/03/2020 12/02/2018 02/08/2018  PHQ-9 Total Score 2 11 2    He was curious to what his blood type is as his daughter needs a kidney transplant and he wants to know sooner than later if he could even potentially be a match.   Medications: Outpatient Medications Prior to Visit  Medication Sig   LORazepam (ATIVAN) 0.5 MG tablet Take 1 tablet (0.5 mg total) by mouth every 8 (eight) hours as needed for up to 10 days for anxiety.   [DISCONTINUED] amLODipine-benazepril (LOTREL) 5-10 MG capsule Take 1 capsule by mouth daily. OFFICE VISIT NEEDED FOR ADDITIONAL REFILLS   [DISCONTINUED] cyclobenzaprine (FLEXERIL) 10 MG tablet Take 1 tablet (10 mg total) by mouth 3 (three) times daily as needed for muscle spasms. (Patient not taking: Reported on 12/03/2020)   [DISCONTINUED] escitalopram (LEXAPRO) 10 MG tablet Take 1 tablet (10 mg total) by mouth daily.   No facility-administered medications prior to visit.    Review of Systems  Psychiatric/Behavioral:  Positive for suicidal ideas. The patient is  nervous/anxious.    Last CBC Lab Results  Component Value Date   WBC 6.2 10/11/2018   HGB 16.2 10/11/2018   HCT 45.8 10/11/2018   MCV 89 10/11/2018   MCH 31.5 10/11/2018   RDW 11.7 10/11/2018   PLT 286 10/11/2018   Last metabolic panel Lab Results  Component Value Date   GLUCOSE 96 10/11/2018   NA 139 10/11/2018   K 4.5 10/11/2018   CL 101 10/11/2018   CO2 22 10/11/2018   BUN 11 10/11/2018   CREATININE 1.14 10/11/2018   GFRNONAA 85 10/11/2018   CALCIUM 9.9 10/11/2018   PROT 7.7 10/11/2018   ALBUMIN 4.9  10/11/2018   LABGLOB 2.8 10/11/2018   AGRATIO 1.8 10/11/2018   BILITOT 0.5 10/11/2018   ALKPHOS 71 10/11/2018   AST 78 (H) 10/11/2018   ALT 101 (H) 10/11/2018   ANIONGAP 11 08/16/2016   Last lipids Lab Results  Component Value Date   CHOL 160 10/11/2018   HDL 59 10/11/2018   LDLCALC 82 10/11/2018   TRIG 107 10/11/2018   CHOLHDL 2.7 10/11/2018   Last hemoglobin A1c Lab Results  Component Value Date   HGBA1C 5.0 10/11/2018   Last thyroid functions Lab Results  Component Value Date   TSH 2.070 10/11/2018   Last vitamin D No results found for: 25OHVITD2, 25OHVITD3, VD25OH Last vitamin B12 and Folate No results found for: VITAMINB12, FOLATE     Objective    BP 122/84 (BP Location: Right Arm, Patient Position: Sitting, Cuff Size: Large)   Pulse 90   Ht 6' (1.829 m)   Wt 246 lb 3.2 oz (111.7 kg)   SpO2 98%   BMI 33.39 kg/m  BP Readings from Last 3 Encounters:  12/03/20 122/84  09/07/20 123/89  09/12/19 (!) 160/90   Wt Readings from Last 3 Encounters:  12/03/20 246 lb 3.2 oz (111.7 kg)  09/07/20 238 lb (108 kg)  09/12/19 228 lb (103.4 kg)      Physical Exam Constitutional:      General: He is awake.     Appearance: He is well-developed.  HENT:     Head: Normocephalic.  Eyes:     Conjunctiva/sclera: Conjunctivae normal.  Cardiovascular:     Rate and Rhythm: Normal rate and regular rhythm.     Heart sounds: Normal heart sounds.  Pulmonary:     Effort: Pulmonary effort is normal.     Breath sounds: Normal breath sounds.  Skin:    General: Skin is warm.  Neurological:     Mental Status: He is alert and oriented to person, place, and time.  Psychiatric:        Attention and Perception: Attention normal.        Mood and Affect: Mood normal.        Speech: Speech normal.        Behavior: Behavior is cooperative.     No results found for any visits on 12/03/20.  Assessment & Plan     Problem List Items Addressed This Visit       Cardiovascular and  Mediastinum   Essential hypertension    Stable and controlled on medication. Refilled.      Relevant Medications   amLODipine-benazepril (LOTREL) 5-10 MG capsule   Other Relevant Orders   Comprehensive Metabolic Panel (CMET)     Other   Panic disorder - Primary    Manages with Ativan currently, one 30 pill bottle will last much more than a month.  We discussed lexapro  and SSRIs in general, their MOA and purpose to decrease incidence of panic attacks. I advised if he feels they occur more often or become more debilitating, to discuss again potentially starting another SSRI.   We will f/u 6 mo but will monitor ativan usage w/ refill requests. Encouraged him to keep going to meetings at church.      Other Visit Diagnoses     Encounter for blood typing       Relevant Orders   ABO/Rh        Return in about 6 months (around 06/02/2021) for anxiety, hypertension.      I, [name], PA-C have reviewed all documentation for this visit. The documentation on  12/03/2020 for the exam, diagnosis, procedures, and orders are all accurate and complete.    [name], PA-C  [facility] [phone] (phone) [phone] (fax)  [facility]

## 2020-12-03 NOTE — Assessment & Plan Note (Signed)
Stable and controlled on medication. Refilled.

## 2020-12-04 LAB — COMPREHENSIVE METABOLIC PANEL
ALT: 98 IU/L — ABNORMAL HIGH (ref 0–44)
AST: 57 IU/L — ABNORMAL HIGH (ref 0–40)
Albumin/Globulin Ratio: 1.8 (ref 1.2–2.2)
Albumin: 4.8 g/dL (ref 4.0–5.0)
Alkaline Phosphatase: 68 IU/L (ref 44–121)
BUN/Creatinine Ratio: 10 (ref 9–20)
BUN: 11 mg/dL (ref 6–20)
Bilirubin Total: 0.4 mg/dL (ref 0.0–1.2)
CO2: 24 mmol/L (ref 20–29)
Calcium: 9.6 mg/dL (ref 8.7–10.2)
Chloride: 99 mmol/L (ref 96–106)
Creatinine, Ser: 1.07 mg/dL (ref 0.76–1.27)
Globulin, Total: 2.7 g/dL (ref 1.5–4.5)
Glucose: 85 mg/dL (ref 70–99)
Potassium: 4.5 mmol/L (ref 3.5–5.2)
Sodium: 139 mmol/L (ref 134–144)
Total Protein: 7.5 g/dL (ref 6.0–8.5)
eGFR: 93 mL/min/{1.73_m2} (ref >59)

## 2020-12-04 LAB — ABO/RH: Rh Factor: POSITIVE

## 2021-01-18 ENCOUNTER — Encounter: Payer: Self-pay | Admitting: Family Medicine

## 2021-01-18 ENCOUNTER — Ambulatory Visit: Payer: Self-pay | Admitting: *Deleted

## 2021-01-18 ENCOUNTER — Ambulatory Visit (INDEPENDENT_AMBULATORY_CARE_PROVIDER_SITE_OTHER): Payer: 59 | Admitting: Family Medicine

## 2021-01-18 DIAGNOSIS — R051 Acute cough: Secondary | ICD-10-CM | POA: Diagnosis not present

## 2021-01-18 DIAGNOSIS — J069 Acute upper respiratory infection, unspecified: Secondary | ICD-10-CM | POA: Diagnosis not present

## 2021-01-18 DIAGNOSIS — R0981 Nasal congestion: Secondary | ICD-10-CM

## 2021-01-18 LAB — POCT INFLUENZA A/B
Influenza A, POC: NEGATIVE
Influenza B, POC: NEGATIVE

## 2021-01-18 MED ORDER — METHYLPREDNISOLONE 4 MG PO TBPK: ORAL_TABLET | ORAL | 0 refills | Status: DC

## 2021-01-18 NOTE — Telephone Encounter (Signed)
°  Chief Complaint: chest heaviness and congestion, coughing for 3 days Symptoms: chest pain with deep breaths and when coughing, feels terrible,nasal congestion Frequency: constantly Pertinent Negatives: Patient denies cardiac issues.    Disposition: [] ED /[] Urgent Care (no appt availability in office) / [x] Appointment(In office/virtual)/ []  [facility]/ [] Home Care/ [] Refused Recommended Disposition /[] [facility]/ []  Follow-up with PCP Additional Notes: Has an appt for today at 9:40 virtually.

## 2021-01-18 NOTE — Telephone Encounter (Signed)
Reason for Disposition  [1] MILD difficulty breathing (e.g., minimal/no SOB at rest, SOB with walking, pulse <100) AND [2] still present when not coughing  Answer Assessment - Initial Assessment Questions 1. ONSET: "When did the cough begin?"      I'm coughing up stuff.   I feel bad.   I'm taking Tylenol Cold and Flu and it's not helping.    My nose is so congested.    He is sitting in the car in the parking lot at work.    I instructed him not to be around others.     He has pain when he coughs and takes a deep.   I feel so bad.    He has an virtual appt this morning at 9:40 I instructed him to keep that appt and to avoid being around other people since he is sick and coughing. 2. SEVERITY: "How bad is the cough today?"      *No Answer* 3. SPUTUM: "Describe the color of your sputum" (none, dry cough; clear, white, yellow, green)     He is coughing up mucus 4. HEMOPTYSIS: "Are you coughing up any blood?" If so ask: "How much?" (flecks, streaks, tablespoons, etc.)     *No Answer* 5. DIFFICULTY BREATHING: "Are you having difficulty breathing?" If Yes, ask: "How bad is it?" (e.g., mild, moderate, severe)    - MILD: No SOB at rest, mild SOB with walking, speaks normally in sentences, can lie down, no retractions, pulse < 100.    - MODERATE: SOB at rest, SOB with minimal exertion and prefers to sit, cannot lie down flat, speaks in phrases, mild retractions, audible wheezing, pulse 100-120.    - SEVERE: Very SOB at rest, speaks in single words, struggling to breathe, sitting hunched forward, retractions, pulse > 120      Hurts to take a deep breath and when coughing in his chest. 6. FEVER: "Do you have a fever?" If Yes, ask: "What is your temperature, how was it measured, and when did it start?"     *No Answer* 7. CARDIAC HISTORY: "Do you have any history of heart disease?" (e.g., heart attack, congestive heart failure)      *No Answer* 8. LUNG HISTORY: "Do you have any history of lung disease?"   (e.g., pulmonary embolus, asthma, emphysema)     *No Answer* 9. PE RISK FACTORS: "Do you have a history of blood clots?" (or: recent major surgery, recent prolonged travel, bedridden)     *No Answer* 10. OTHER SYMPTOMS: "Do you have any other symptoms?" (e.g., runny nose, wheezing, chest pain)       *No Answer* 11. PREGNANCY: "Is there any chance you are pregnant?" "When was your last menstrual period?"       *No Answer* 12. TRAVEL: "Have you traveled out of the country in the last month?" (e.g., travel history, exposures)       *No Answer*  Protocols used: Cough - Acute Productive-A-AH

## 2021-01-18 NOTE — Progress Notes (Signed)
Virtual Visit via Video Note  I connected with Jason Stanton on 01/18/21 at  9:40 AM EST by a video enabled telemedicine application and verified that I am speaking with the correct person using two identifiers.  Location: Patient: home Provider: [facility]   I discussed the limitations of evaluation and management by telemedicine and the availability of in person appointments. The patient expressed understanding and agreed to proceed.  History of Present Illness:  UPPER RESPIRATORY TRACT INFECTION - symptom onset 3 days ago - COVID negative 2 days ago.   Fever: no, but having chills Cough: yes, productive of phlegm Shortness of breath: no Chest pain: yes, with cough Chest tightness: yes Chest congestion: yes Nasal congestion: yes Sinus pressure: yes Headache: yes Vomiting: no Fatigue: yes Context: worse Recurrent sinusitis: no Relief with OTC cold/cough medications: no  Treatments attempted:  dayquil, nyquil, tylenol severe sinus    Observations/Objective:  Tired appearing, in NAD. Speaks in full sentences, no resp distress.  Assessment and Plan:  VIRAL URI Moderate sx. Would be a candidate for COVID treatment if positive. Daughter on immunosuppressant for kidney disease, would likely be a candidate for post-exposure prophylaxis if patient positive. Rx medrol given lack of relief with OTC medications.  Reviewed self-quarantine guidelines, and emergency precautions.      I discussed the assessment and treatment plan with the patient. The patient was provided an opportunity to ask questions and all were answered. The patient agreed with the plan and demonstrated an understanding of the instructions.   The patient was advised to call back or seek an in-person evaluation if the symptoms worsen or if the condition fails to improve as anticipated.  I provided 9 minutes of non-face-to-face time during this encounter.   April Miller, [place]

## 2021-01-18 NOTE — Patient Instructions (Signed)
It was great to see you!  Our plans for today:  - See below for self-isolation guidelines. You may end your quarantine if your test is negative or if positive once you are 10 days from symptom onset and fever free for 24 hours without use of tylenol or ibuprofen. Wear a well-fitting N95 mask if you have to go out an about. - I recommend getting vaccinated once you are healed from your current infection if you haven't already done so. - Certainly, if you are having difficulties breathing or unable to keep down fluids, go to the Emergency Department.   Take care and seek immediate care sooner if you develop any concerns.   Dr. Linwood Dibbles     Person Under Monitoring Name: Jason Stanton [facility]  Location: [address] [place] [address]   Infection Prevention Recommendations for Individuals Confirmed to have, or Being Evaluated for, 2019 Novel Coronavirus (COVID-19) Infection Who Receive Care at Home  Individuals who are confirmed to have, or are being evaluated for, COVID-19 should follow the prevention steps below until a healthcare provider or local or state health department says they can return to normal activities.  Stay home except to get medical care You should restrict activities outside your home, except for getting medical care. Do not go to work, school, or public areas, and do not use public transportation or taxis.  Call ahead before visiting your doctor Before your medical appointment, call the healthcare provider and tell them that you have, or are being evaluated for, COVID-19 infection. This will help the healthcare providers office take steps to keep other people from getting infected. Ask your healthcare provider to call the local or state health department.  Monitor your symptoms Seek prompt medical attention if your illness is worsening (e.g., difficulty breathing). Before going to your medical appointment, call the healthcare provider and tell them  that you have, or are being evaluated for, COVID-19 infection. Ask your healthcare provider to call the local or state health department.  Wear a facemask You should wear a facemask that covers your nose and mouth when you are in the same room with other people and when you visit a healthcare provider. People who live with or visit you should also wear a facemask while they are in the same room with you.  Separate yourself from other people in your home As much as possible, you should stay in a different room from other people in your home. Also, you should use a separate bathroom, if available.  Avoid sharing household items You should not share dishes, drinking glasses, cups, eating utensils, towels, bedding, or other items with other people in your home. After using these items, you should wash them thoroughly with soap and water.  Cover your coughs and sneezes Cover your mouth and nose with a tissue when you cough or sneeze, or you can cough or sneeze into your sleeve. Throw used tissues in a lined trash can, and immediately wash your hands with soap and water for at least 20 seconds or use an alcohol-based hand rub.  Wash your [employer] your hands often and thoroughly with soap and water for at least 20 seconds. You can use an alcohol-based hand sanitizer if soap and water are not available and if your hands are not visibly dirty. Avoid touching your eyes, nose, and mouth with unwashed hands.   Prevention Steps for Caregivers and Household Members of Individuals Confirmed to have, or Being Evaluated for, COVID-19 Infection Being  Cared for in the Home  If you live with, or provide care at home for, a person confirmed to have, or being evaluated for, COVID-19 infection please follow these guidelines to prevent infection:  Follow healthcare providers instructions Make sure that you understand and can help the patient follow any healthcare provider instructions for all  care.  Provide for the patients basic needs You should help the patient with basic needs in the home and provide support for getting groceries, prescriptions, and other personal needs.  Monitor the patients symptoms If they are getting sicker, call his or her medical provider and tell them that the patient has, or is being evaluated for, COVID-19 infection. This will help the healthcare providers office take steps to keep other people from getting infected. Ask the healthcare provider to call the local or state health department.  Limit the number of people who have contact with the patient If possible, have only one caregiver for the patient. Other household members should stay in another home or place of residence. If this is not possible, they should stay in another room, or be separated from the patient as much as possible. Use a separate bathroom, if available. Restrict visitors who do not have an essential need to be in the home.  Keep older adults, very young children, and other sick people away from the patient Keep older adults, very young children, and those who have compromised immune systems or chronic health conditions away from the patient. This includes people with chronic heart, lung, or kidney conditions, diabetes, and cancer.  Ensure good ventilation Make sure that shared spaces in the home have good air flow, such as from an air conditioner or an opened window, weather permitting.  Wash your hands often Wash your hands often and thoroughly with soap and water for at least 20 seconds. You can use an alcohol based hand sanitizer if soap and water are not available and if your hands are not visibly dirty. Avoid touching your eyes, nose, and mouth with unwashed hands. Use disposable paper towels to dry your hands. If not available, use dedicated cloth towels and replace them when they become wet.  Wear a facemask and gloves Wear a disposable facemask at all times in  the room and gloves when you touch or have contact with the patients blood, body fluids, and/or secretions or excretions, such as sweat, saliva, sputum, nasal mucus, vomit, urine, or feces.  Ensure the mask fits over your nose and mouth tightly, and do not touch it during use. Throw out disposable facemasks and gloves after using them. Do not reuse. Wash your hands immediately after removing your facemask and gloves. If your personal clothing becomes contaminated, carefully remove clothing and launder. Wash your hands after handling contaminated clothing. Place all used disposable facemasks, gloves, and other waste in a lined container before disposing them with other household waste. Remove gloves and wash your hands immediately after handling these items.  Do not share dishes, glasses, or other household items with the patient Avoid sharing household items. You should not share dishes, drinking glasses, cups, eating utensils, towels, bedding, or other items with a patient who is confirmed to have, or being evaluated for, COVID-19 infection. After the person uses these items, you should wash them thoroughly with soap and water.  Wash laundry thoroughly Immediately remove and wash clothes or bedding that have blood, body fluids, and/or secretions or excretions, such as sweat, saliva, sputum, nasal mucus, vomit, urine, or feces, on them.  Wear gloves when handling laundry from the patient. Read and follow directions on labels of laundry or clothing items and detergent. In general, wash and dry with the warmest temperatures recommended on the label.  Clean all areas the individual has used often Clean all touchable surfaces, such as counters, tabletops, doorknobs, bathroom fixtures, toilets, phones, keyboards, tablets, and bedside tables, every day. Also, clean any surfaces that may have blood, body fluids, and/or secretions or excretions on them. Wear gloves when cleaning surfaces the patient has  come in contact with. Use a diluted bleach solution (e.g., dilute bleach with 1 part bleach and 10 parts water) or a household disinfectant with a label that says EPA-registered for coronaviruses. To make a bleach solution at home, add 1 tablespoon of bleach to 1 quart (4 cups) of water. For a larger supply, add  cup of bleach to 1 gallon (16 cups) of water. Read labels of cleaning products and follow recommendations provided on product labels. Labels contain instructions for safe and effective use of the cleaning product including precautions you should take when applying the product, such as wearing gloves or eye protection and making sure you have good ventilation during use of the product. Remove gloves and wash hands immediately after cleaning.  Monitor yourself for signs and symptoms of illness Caregivers and household members are considered close contacts, should monitor their health, and will be asked to limit movement outside of the home to the extent possible. Follow the monitoring steps for close contacts listed on the symptom monitoring form.   ? If you have additional questions, contact your local health department or call the epidemiologist on call at [phone] (available 24/7). ? This guidance is subject to change. For the most up-to-date guidance from [facility], please refer to their website: [email or web address]

## 2021-01-19 LAB — SPECIMEN STATUS REPORT

## 2021-01-19 LAB — NOVEL CORONAVIRUS, NAA: SARS-CoV-2, NAA: NOT DETECTED

## 2021-01-19 LAB — SARS-COV-2, NAA 2 DAY TAT

## 2021-01-28 ENCOUNTER — Ambulatory Visit: Payer: Self-pay | Admitting: *Deleted

## 2021-01-28 ENCOUNTER — Encounter: Payer: Self-pay | Admitting: *Deleted

## 2021-01-28 NOTE — Telephone Encounter (Signed)
Patient returned call regarding right foot/leg numbness and more difficulty falling asleep due to discomfort. Already triaged by another NT. Requesting appt change due to work issues. Appt rescheduled for 02/04/21.

## 2021-01-28 NOTE — Telephone Encounter (Signed)
This encounter was created in error - please disregard.

## 2021-01-28 NOTE — Telephone Encounter (Signed)
Reason for Disposition  Numbness in a leg or foot (i.e., loss of sensation)  Answer Assessment - Initial Assessment Questions 1. ONSET: "When did the pain start?"      Pt c/o numbness in right leg when laying or sitting down.   It's worse at night.  My thigh, knee and shin and my right foot feels like it's going numb.   I'm walking on it fine.   2. LOCATION: "Where is the pain located?"      See above No injuries or accidents.   I had a URI last week.    I took steroids for it.   3. PAIN: "How bad is the pain?"    (Scale 1-10; or mild, moderate, severe)   -  MILD (1-3): doesn't interfere with normal activities    -  MODERATE (4-7): interferes with normal activities (e.g., work or school) or awakens from sleep, limping    -  SEVERE (8-10): excruciating pain, unable to do any normal activities, unable to walk     *No Answer* 4. WORK OR EXERCISE: "Has there been any recent work or exercise that involved this part of the body?"      *No Answer* 5. CAUSE: "What do you think is causing the leg pain?"     *No Answer* 6. OTHER SYMPTOMS: "Do you have any other symptoms?" (e.g., chest pain, back pain, breathing difficulty, swelling, rash, fever, numbness, weakness)     *No Answer* 7. PREGNANCY: "Is there any chance you are pregnant?" "When was your last menstrual period?"     *No Answer*  Protocols used: Leg Pain-A-AH

## 2021-01-28 NOTE — Telephone Encounter (Signed)
°  Chief Complaint: numbness in right leg in knee, shin and foot when he lays down or is sitting.   Walking fine. Symptoms: numbness Frequency: for the last week Pertinent Negatives: Patient denies pain.   Disposition: [] ED /[] Urgent Care (no appt availability in office) / [x] Appointment(In office/virtual)/ []  [facility]/ [] Home Care/ [] Refused Recommended Disposition /[] [facility]/ []  Follow-up with PCP Additional Notes: I mentioned he needed to check with his boss and see if he could get off the 02/01/2021 at 9:20 with Erin Mecum, PA and then the line went dead or he hung up.   I attempted to call him back but got his voicemail.   Left a message that I made the appt for him and to call if he needed to change the it.

## 2021-02-01 ENCOUNTER — Ambulatory Visit: Payer: 59 | Admitting: Physician Assistant

## 2021-02-04 ENCOUNTER — Ambulatory Visit (INDEPENDENT_AMBULATORY_CARE_PROVIDER_SITE_OTHER): Payer: 59 | Admitting: Physician Assistant

## 2021-02-04 ENCOUNTER — Encounter: Payer: Self-pay | Admitting: Physician Assistant

## 2021-02-04 ENCOUNTER — Other Ambulatory Visit: Payer: Self-pay

## 2021-02-04 VITALS — BP 114/76 | HR 86 | Temp 98.6°F | Resp 16 | Ht 72.0 in | Wt 243.0 lb

## 2021-02-04 DIAGNOSIS — F410 Panic disorder [episodic paroxysmal anxiety] without agoraphobia: Secondary | ICD-10-CM | POA: Diagnosis not present

## 2021-02-04 DIAGNOSIS — M79604 Pain in right leg: Secondary | ICD-10-CM | POA: Diagnosis not present

## 2021-02-04 DIAGNOSIS — R202 Paresthesia of skin: Secondary | ICD-10-CM | POA: Insufficient documentation

## 2021-02-04 MED ORDER — LORAZEPAM 0.5 MG PO TABS: 0.5000 mg | ORAL_TABLET | Freq: Three times a day (TID) | ORAL | 0 refills | Status: DC

## 2021-02-04 MED ORDER — MELOXICAM 15 MG PO TABS: 15.0000 mg | ORAL_TABLET | Freq: Every day | ORAL | 0 refills | Status: DC

## 2021-02-04 NOTE — Assessment & Plan Note (Signed)
Acute, new problem, unmanaged and uncontrolled via at-home measures Comprised of discomfort and tingling, numbness in thigh with radiation to shin Suspicious of sciatic involvement given symptoms  Recommend gentle stretches at home, NSAIDs, warm compresses until established with PT Referral to PT provided today for evaluation and management Rx for Meloxicam 15 mg PO QD provided for inflammation and pain relief Follow up after PT, if symptoms are not improving, to discuss potential referral to Orthopedics for eval and management at that time

## 2021-02-04 NOTE — Progress Notes (Signed)
Established patient visit  I,April Miller,acting as a scribe for [employer], PA-C.,have documented all relevant documentation on the behalf of Lemonte Al E Joslyne Marshburn, PA-C,as directed by  Denny Peon E Tashawnda Bleiler, PA-C while in the presence of Beonca Gibb E Debra Calabretta, PA-C.   Patient: Jason Stanton   DOB: Mar 08, 1985   36 y.o. Male  MRN: [id number] Visit Date: 02/04/2021  Today's healthcare provider: Oswaldo Conroy Assyria Morreale, PA-C   Introduced myself to the patient as a Secondary school teacher and provided education on APPs in clinical practice.   CC: Right leg discomfort/ numbness  Subjective    HPI   Patient states he was taking prednisone about 2 weeks ago. Before he completed prednisone patient began having numbness in his right leg and tingling in his right foot. Patient states symptoms occur intermittently. Usually when he is trying to sleep. Patient states there is no pain, just discomfort.  States this began on last day of steroid taper- started in thigh and extends to shin, and foot when laying down Reports it is more discomfort, keeping him awake at night Describes foot as having a tingling sensation  States he is fine while upright and walking   Denies injuries or trauma to the area.  Has been through multiple bottles of IcyHot to relieve pain- only lasts 10 minutes Denies relief from stretches Aggravating factors: laying down at night States he feels fine in the AM  Describes pattern of discomfort along L3-L4 dermatome     Medications: Outpatient Medications Prior to Visit  Medication Sig   amLODipine-benazepril (LOTREL) 5-10 MG capsule Take 1 capsule by mouth daily. OFFICE VISIT NEEDED FOR ADDITIONAL REFILLS   [DISCONTINUED] LORazepam (ATIVAN) 0.5 MG tablet Take 0.5 mg by mouth every 8 (eight) hours.   [DISCONTINUED] methylPREDNISolone (MEDROL DOSEPAK) 4 MG TBPK tablet Day 1: Take 8 mg (2 tablets) before breakfast, 4 mg (1 tablet) after lunch, 4 mg (1 tablet) after supper, and 8 mg (2 tablets) at bedtime. Day  2:Take 4 mg (1 tablet) before breakfast, 4 mg (1 tablet) after lunch, 4 mg (1 tablet) after supper, and 8 mg (2 tablets) at bedtime. Day 3: Take 4 mg (1 tablet) before breakfast, 4 mg (1 tablet) after lunch, 4 mg (1 tablet) after supper, and 4 mg (1 tablet) at bedtime. Day 4: Take 4 mg (1 tablet) before breakfast, 4 mg (1 tablet) after lunch, and 4 mg (1 tablet) at bedtime. Day 5: Take 4 mg (1 tablet) before breakfast and 4 mg (1 tablet) at bedtime. Day 6: Take 4 mg (1 tablet) before breakfast. (Patient not taking: Reported on 02/04/2021)   No facility-administered medications prior to visit.    Review of Systems  Genitourinary:        Denies bowel and bladder incontinence   Musculoskeletal:  Positive for arthralgias and myalgias.       Right leg pain along thigh and shin   Neurological:  Positive for numbness. Negative for weakness.      Objective    BP 114/76 (BP Location: Right Arm, Patient Position: Sitting, Cuff Size: Large)    Pulse 86    Temp 98.6 F (37 C) (Temporal)    Resp 16    Ht 6' (1.829 m)    Wt 243 lb (110.2 kg)    BMI 32.96 kg/m  {Show previous vital signs (optional):23777}  Physical Exam Constitutional:      Appearance: Normal appearance. He is obese.  HENT:     Head: Normocephalic and  atraumatic.  Eyes:     Extraocular Movements: Extraocular movements intact.     Conjunctiva/sclera: Conjunctivae normal.     Pupils: Pupils are equal, round, and reactive to light.  Cardiovascular:     Rate and Rhythm: Normal rate and regular rhythm.     Pulses: Normal pulses.     Heart sounds: Normal heart sounds.     Comments: Dorsalis pedis pulses intact bilaterally  Musculoskeletal:     Cervical back: Normal range of motion and neck supple.     Lumbar back: Normal. No deformity, tenderness or bony tenderness. Normal range of motion. Negative right straight leg raise test and negative left straight leg raise test.     Right hip: Normal. No bony tenderness. Normal range of  motion.     Left hip: Normal. No bony tenderness. Normal range of motion.     Right upper leg: Normal. No bony tenderness.     Left upper leg: Normal. No bony tenderness.     Right knee: Normal range of motion.     Instability Tests: Medial McMurray test negative and lateral McMurray test negative.     Left knee: Normal range of motion.     Instability Tests: Medial McMurray test negative and lateral McMurray test negative.     Comments: No SI joint tenderness Grind test negative bilateral knees   Neurological:     Mental Status: He is alert and oriented to person, place, and time.     Motor: Motor function is intact. No weakness.     Gait: Gait is intact.     Deep Tendon Reflexes:     Reflex Scores:      Patellar reflexes are 2+ on the right side and 2+ on the left side. Psychiatric:        Mood and Affect: Mood normal.        Behavior: Behavior normal.        Thought Content: Thought content normal.        Judgment: Judgment normal.      No results found for any visits on 02/04/21.  Assessment & Plan     Problem List Items Addressed This Visit       Other   Panic disorder    Chronic, with intermittent exacerbations, managed with Ativan  States 30 pills of  Ativan usually lasts for multiple months - reports a recent increase in stress at this time due to family health problems and holidays but denies daily use of Ativan Discussed options for preventive medications such as SSRIs and SNRIs but he does not think that is necessary at this time.  Refill provided today        Relevant Medications   LORazepam (ATIVAN) 0.5 MG tablet   Acute leg pain, right - Primary    Acute, new problem, unmanaged and uncontrolled via at-home measures Comprised of discomfort and tingling, numbness in thigh with radiation to shin Suspicious of sciatic involvement given symptoms  Recommend gentle stretches at home, NSAIDs, warm compresses until established with PT Referral to PT provided today  for evaluation and management Rx for Meloxicam 15 mg PO QD provided for inflammation and pain relief Follow up after PT, if symptoms are not improving, to discuss potential referral to Orthopedics for eval and management at that time       Relevant Medications   meloxicam (MOBIC) 15 MG tablet   Other Relevant Orders   Ambulatory referral to Physical Therapy     No follow-ups  on file.   I, Meighan Treto E Armida Vickroy, PA-C, have reviewed all documentation for this visit. The documentation on 02/04/21 for the exam, diagnosis, procedures, and orders are all accurate and complete.   Jackelynn Hosie, Mirian Mo MPH [facility]    No follow-ups on file.         Providence Crosby, PA-C  [facility] [phone] (phone) [phone] (fax)  [facility]

## 2021-02-04 NOTE — Patient Instructions (Addendum)
Based on your symptoms I believe that you have inflammation surrounding your right sciatic nerve  I am sending a script for Meloxicam to help with pain and inflammation  I am sending in a referral to physical therapy (PT)  to help manage and provide more lasting relief  I have included some stretches to do until you are established with PT Please try to rest the area and you can use warm compresses to the area to help You can use Voltaren gel to help with further pain management - follow manufacturers instructions  If your symptoms are not improving after doing the stretches, and attending PT for several weeks please call us and follow up so we can provide you with a referral to Orthopedics for evaluation.   It was nice to meet you and I appreciate the opportunity to be involved in your care

## 2021-02-04 NOTE — Assessment & Plan Note (Signed)
Chronic, with intermittent exacerbations, managed with Ativan  States 30 pills of  Ativan usually lasts for multiple months - reports a recent increase in stress at this time due to family health problems and holidays but denies daily use of Ativan Discussed options for preventive medications such as SSRIs and SNRIs but he does not think that is necessary at this time.  Refill provided today

## 2021-03-30 ENCOUNTER — Encounter: Payer: Self-pay | Admitting: Physician Assistant

## 2021-03-30 ENCOUNTER — Ambulatory Visit: Payer: 59 | Admitting: Physician Assistant

## 2021-03-30 ENCOUNTER — Other Ambulatory Visit: Payer: Self-pay

## 2021-03-30 VITALS — BP 125/93 | HR 96 | Temp 97.7°F | Ht 71.0 in | Wt 240.2 lb

## 2021-03-30 DIAGNOSIS — M79641 Pain in right hand: Secondary | ICD-10-CM | POA: Diagnosis not present

## 2021-03-30 DIAGNOSIS — R202 Paresthesia of skin: Secondary | ICD-10-CM | POA: Diagnosis not present

## 2021-03-30 DIAGNOSIS — J069 Acute upper respiratory infection, unspecified: Secondary | ICD-10-CM | POA: Diagnosis not present

## 2021-03-30 DIAGNOSIS — F410 Panic disorder [episodic paroxysmal anxiety] without agoraphobia: Secondary | ICD-10-CM

## 2021-03-30 DIAGNOSIS — M79642 Pain in left hand: Secondary | ICD-10-CM

## 2021-03-30 LAB — POCT RAPID STREP A (OFFICE): Rapid Strep A Screen: NEGATIVE

## 2021-03-30 MED ORDER — METHYLPREDNISOLONE 4 MG PO TBPK
ORAL_TABLET | ORAL | 0 refills | Status: DC
Start: 2021-03-30 — End: 2022-07-14

## 2021-03-30 MED ORDER — LORAZEPAM 0.5 MG PO TABS: 0.5000 mg | ORAL_TABLET | Freq: Three times a day (TID) | ORAL | 0 refills | Status: DC | PRN

## 2021-03-30 NOTE — Assessment & Plan Note (Addendum)
Unilateral+ positional, no prior history of glucose issues ?Rx medrol dose pack-- gave pt option of taking or continuing with stretches more aggressively--on a daily basis. ?If no improvement regardless, would refer to ortho for eval as paresthesias are positional.  ?

## 2021-03-30 NOTE — Progress Notes (Signed)
? ?I,[name],acting as a Education administrator for [employer], PA-C.,have documented all relevant documentation on the behalf of Jason Kirschner, PA-C,as directed by  Jason Kirschner, PA-C while in the presence of Jason Kirschner, PA-C.  ?Acute Office Visit ? ?Subjective:  ? ? Patient ID: Jason Stanton, male    DOB: 1985/05/20, 36 y.o.   MRN: [id number] ? ?Prentiss is a 36 y/o male who presents today with multiple concerns. ? ?For the last few days, dry cough, runny nose, fatigue. Has been taking OTC cold medication, tylenol cold and severe, mucinex, zyrtec, nyquil with no improvement. Denies fevers, chills, sob, chest pain, dizziness. ? ?He reports since 1/23 his right leg has been falling asleep (pins and needles sensation) in certain positions. He was seen 01/18/21 for a URI and rx a medrol dose pack, after which he began feeling these paresthesias. Positional--only when lying flat or if his right leg is extended for a long period of time (driving). Can get sensation to come back when up and ambulating. ? ?He also reports off and on hand pain, usually after he has been very active. Not frequent, but very bothersome when it does happen. Denies swelling, erythema, weakness.  ? ? ?Past Medical History:  ?Diagnosis Date  ? Heart murmur   ? ? ?Past Surgical History:  ?Procedure Laterality Date  ? VASECTOMY    ? ? ?Family History  ?Problem Relation Age of Onset  ? Cancer Mother   ? ? ?Social History  ? ?Socioeconomic History  ? Marital status: Married  ?  Spouse name: Not on file  ? Number of children: Not on file  ? Years of education: Not on file  ? Highest education level: Not on file  ?Occupational History  ? Not on file  ?Tobacco Use  ? Smoking status: Former  ?  Packs/day: 0.50  ?  Years: 2.00  ?  Pack years: 1.00  ?  Types: Cigarettes  ?  Quit date: 07/03/2020  ?  Years since quitting: 0.7  ? Smokeless tobacco: Never  ? Tobacco comments:  ?  Stopped 5 months ago but still has urges  ?Vaping Use  ? Vaping Use: Some  days  ?Substance and Sexual Activity  ? Alcohol use: Yes  ?  Alcohol/week: 1.0 - 2.0 standard drink  ?  Types: 1 - 2 Cans of beer per week  ? Drug use: No  ? Sexual activity: Yes  ?  Birth control/protection: Surgical  ?  Comment: Vasectomy 09/12/2019  ?Other Topics Concern  ? Not on file  ?Social History Narrative  ? Not on file  ? ?Social Determinants of Health  ? ?Financial Resource Strain: Not on file  ?Food Insecurity: Not on file  ?Transportation Needs: Not on file  ?Physical Activity: Not on file  ?Stress: Not on file  ?Social Connections: Not on file  ?Intimate Partner Violence: Not on file  ? ? ?Outpatient Medications Prior to Visit  ?Medication Sig Dispense Refill  ? amLODipine-benazepril (LOTREL) 5-10 MG capsule Take 1 capsule by mouth daily. OFFICE VISIT NEEDED FOR ADDITIONAL REFILLS 90 capsule 2  ? meloxicam (MOBIC) 15 MG tablet Take 1 tablet (15 mg total) by mouth daily. (Patient not taking: Reported on 03/30/2021) 30 tablet 0  ? LORazepam (ATIVAN) 0.5 MG tablet Take 1 tablet (0.5 mg total) by mouth every 8 (eight) hours. (Patient not taking: Reported on 03/30/2021) 30 tablet 0  ? ?No facility-administered medications prior to visit.  ? ? ?Allergies  ?Allergen Reactions  ?  Stadol [Butorphanol] Anaphylaxis  ? ? ?Review of Systems  ?Constitutional:  Positive for fatigue. Negative for fever.  ?HENT:  Positive for congestion, postnasal drip, rhinorrhea, sinus pressure, sinus pain and sore throat.   ?Respiratory:  Positive for cough. Negative for shortness of breath.   ?Cardiovascular:  Negative for chest pain, palpitations and leg swelling.  ?Neurological:  Negative for dizziness and headaches.  ? ?   ?Objective:  ?  ?Physical Exam ?Constitutional:   ?   General: He is awake.  ?   Appearance: He is well-developed.  ?HENT:  ?   Head: Normocephalic.  ?   Right Ear: A middle ear effusion is present. Tympanic membrane is not erythematous or bulging.  ?   Left Ear: A middle ear effusion is present. Tympanic  membrane is not erythematous or bulging.  ?   Nose: Congestion present.  ?   Mouth/Throat:  ?   Pharynx: Posterior oropharyngeal erythema present. No oropharyngeal exudate.  ?Eyes:  ?   Conjunctiva/sclera: Conjunctivae normal.  ?Cardiovascular:  ?   Rate and Rhythm: Normal rate and regular rhythm.  ?   Heart sounds: Normal heart sounds.  ?Pulmonary:  ?   Effort: Pulmonary effort is normal.  ?   Breath sounds: Normal breath sounds.  ?Musculoskeletal:  ?   Right lower leg: No edema.  ?   Left lower leg: No edema.  ?Skin: ?   General: Skin is warm.  ?Neurological:  ?   Mental Status: He is alert and oriented to person, place, and time.  ?   Comments: Grip strength equal b/l hands ?Sensation equal b/l hands  ?Psychiatric:     ?   Attention and Perception: Attention normal.     ?   Mood and Affect: Mood normal.     ?   Speech: Speech normal.     ?   Behavior: Behavior is cooperative.  ? ? ?BP (!) 125/93 (BP Location: Right Arm, Patient Position: Sitting, Cuff Size: Normal)   Pulse 96   Temp 97.7 ?F (36.5 ?C) (Oral)   Ht _0  (1.803 m)   Wt 240 lb 3.2 oz (109 kg)   SpO2 99%   BMI 33.50 kg/m?  ?Wt Readings from Last 3 Encounters:  ?03/30/21 240 lb 3.2 oz (109 kg)  ?02/04/21 243 lb (110.2 kg)  ?12/03/20 246 lb 3.2 oz (111.7 kg)  ? ? ?Health Maintenance Due  ?Topic Date Due  ? COVID-19 Vaccine (1) Never done  ? Hepatitis C Screening  Never done  ? TETANUS/TDAP  Never done  ? ? ?There are no preventive care reminders to display for this patient. ? ? ?Lab Results  ?Component Value Date  ? TSH 2.070 10/11/2018  ? ?Lab Results  ?Component Value Date  ? WBC 6.2 10/11/2018  ? HGB 16.2 10/11/2018  ? HCT 45.8 10/11/2018  ? MCV 89 10/11/2018  ? PLT 286 10/11/2018  ? ?Lab Results  ?Component Value Date  ? NA 139 12/03/2020  ? K 4.5 12/03/2020  ? CO2 24 12/03/2020  ? GLUCOSE 85 12/03/2020  ? BUN 11 12/03/2020  ? CREATININE 1.07 12/03/2020  ? BILITOT 0.4 12/03/2020  ? ALKPHOS 68 12/03/2020  ? AST 57 (H) 12/03/2020  ? ALT 98 (H)  12/03/2020  ? PROT 7.5 12/03/2020  ? ALBUMIN 4.8 12/03/2020  ? CALCIUM 9.6 12/03/2020  ? ANIONGAP 11 08/16/2016  ? EGFR 93 12/03/2020  ? ?Lab Results  ?Component Value Date  ? CHOL 160 10/11/2018  ? ?  Lab Results  ?Component Value Date  ? HDL 59 10/11/2018  ? ?Lab Results  ?Component Value Date  ? [place] 82 10/11/2018  ? ?Lab Results  ?Component Value Date  ? TRIG 107 10/11/2018  ? ?Lab Results  ?Component Value Date  ? CHOLHDL 2.7 10/11/2018  ? ?Lab Results  ?Component Value Date  ? HGBA1C 5.0 10/11/2018  ? ? ?   ?Assessment & Plan:  ? ?Problem List Items Addressed This Visit   ? ?  ? Other  ? Panic disorder  ?  Stable, ?Takes ativan 0.5 mg PRN panic attacks.  ?Advised again if they occur more frequently may benefit from a temporary daily anxiety medication.  ? ?  ?  ? Relevant Medications  ? LORazepam (ATIVAN) 0.5 MG tablet  ? Paresthesia of right leg  ?  Unilateral+ positional, no prior history of glucose issues ?Rx medrol dose pack-- gave pt option of taking or continuing with stretches more aggressively--on a daily basis. ?If no improvement regardless, would refer to ortho for eval as paresthesias are positional.  ?  ?  ? Relevant Medications  ? methylPREDNISolone (MEDROL DOSEPAK) 4 MG TBPK tablet  ? Pain in both hands  ?  Uncertain of etiology, pt reports prior negative RA w/u as brother has RA ?Will monitor for frequency ?For now, ice/heat, biofreeze, stretches.  ?  ?  ? ?Other Visit Diagnoses   ? ? Upper respiratory tract infection, unspecified type    -  Primary  ? Relevant Orders  ? Coronavirus (COVID-19) with Influenza A and Influenza B  ? POCT rapid strep A  ? ?  ? --URI ?-increase fluids, rest, tylenol for pain. COVID/Flu swab done, poc rapid strep negative. ? ?I, Jason Kirschner, PA-C have reviewed all documentation for this visit. The documentation on  03/30/2021  for the exam, diagnosis, procedures, and orders are all accurate and complete. ? ?Jason Kirschner, PA-C ?[facility] ?[id number] ?[place], [place], [address] ?Office: [phone] ?Fax: [phone]  ? ? ? ? ?

## 2021-03-30 NOTE — Assessment & Plan Note (Signed)
Stable, ?Takes ativan 0.5 mg PRN panic attacks.  ?Advised again if they occur more frequently may benefit from a temporary daily anxiety medication.  ? ?

## 2021-03-30 NOTE — Assessment & Plan Note (Signed)
Uncertain of etiology, pt reports prior negative RA w/u as brother has RA ?Will monitor for frequency ?For now, ice/heat, biofreeze, stretches.  ?

## 2021-04-01 ENCOUNTER — Ambulatory Visit: Payer: 59 | Admitting: Physician Assistant

## 2021-04-01 LAB — COVID-19, FLU A+B NAA
Influenza A, NAA: NOT DETECTED
Influenza B, NAA: NOT DETECTED
SARS-CoV-2, NAA: NOT DETECTED

## 2021-04-01 LAB — SPECIMEN STATUS REPORT

## 2021-04-13 ENCOUNTER — Telehealth: Payer: Self-pay | Admitting: Physician Assistant

## 2021-04-13 ENCOUNTER — Other Ambulatory Visit: Payer: Self-pay | Admitting: Physician Assistant

## 2021-04-13 DIAGNOSIS — R202 Paresthesia of skin: Secondary | ICD-10-CM

## 2021-04-13 NOTE — Telephone Encounter (Signed)
Copied from CRM [id number]. Topic: General - Other ?>> Apr 13, 2021 11:24 AM Jaquita Rector A wrote: ?Reason for CRM: Patient called in to inform Jason Stanton that he is still having issues with his leg and calf at night causing him to loose sleep at night and he is concerned and is now ready to see the specialist that was discussed at last visit. Patient can be reached at Ph# [phone] ?

## 2021-04-26 ENCOUNTER — Other Ambulatory Visit: Payer: Self-pay

## 2021-04-26 ENCOUNTER — Ambulatory Visit: Payer: Self-pay

## 2021-04-26 DIAGNOSIS — M79604 Pain in right leg: Secondary | ICD-10-CM

## 2021-04-26 NOTE — Telephone Encounter (Signed)
?  Chief Complaint: med refill ?Symptoms: R leg numbness, ongoing issue ?Frequency: NA ?Pertinent Negatives: Patient denies pain to RLE ?Disposition: [] ED /[] Urgent Care (no appt availability in office) / [] Appointment(In office/virtual)/ []  [facility]/ [] Home Care/ [] Refused Recommended Disposition /[] [facility]/ [x]  Follow-up with PCP ?Additional Notes: pt states he is out of this medication and is needing refill. His RLE numbness has been ongoing but getting worse. He states he had Ortho appt about a week ago and has MRI scheduled for 04/27/21. I attempted to refill the medication but had to send to provider for approval and explained this to pt. Advised him I sent it HP and to check with pharmacy this evening to see if it had been called in. Pt verbalized understanding.  ? ?Reason for Disposition ? [1] Prescription refill request for NON-ESSENTIAL medicine (i.e., no harm to patient if med not taken) AND [2] triager unable to refill per department policy ? ?Answer Assessment - Initial Assessment Questions ?1. DRUG NAME: "What medicine do you need to have refilled?" ?    meloxicam ?2. REFILLS REMAINING: "How many refills are remaining?" (Note: The label on the medicine or pill bottle will show how many refills are remaining. If there are no refills remaining, then a renewal may be needed.) ?    0 ?4. PRESCRIBING HCP: "Who prescribed it?" Reason: If prescribed by specialist, call should be referred to that group. ?    Erin, PA ?5. SYMPTOMS: "Do you have any symptoms?" ?    R leg numbness, ongoing issue ? ?Protocols used: Medication Refill and Renewal Call-A-AH ? ?

## 2021-04-26 NOTE — Telephone Encounter (Signed)
Requested medication (s) are due for refill today: yes ? ?Requested medication (s) are on the active medication list: yes ? ?Last refill:  02/04/21 #30/0 ? ?Future visit scheduled: no had appt on 03/30/21 ? ?Notes to clinic:  Unable to refill per protocol due to failed labs, no updated results. Pt is out of this medication and needing it urgently as possibly.  ? ?  ?Requested Prescriptions  ?Pending Prescriptions Disp Refills  ? meloxicam (MOBIC) 15 MG tablet 30 tablet 0  ?  Sig: Take 1 tablet (15 mg total) by mouth daily.  ?  ? Analgesics:  COX2 Inhibitors Failed - 04/26/2021  9:42 AM  ?  ?  Failed - Manual Review: Labs are only required if the patient has taken medication for more than 8 weeks.  ?  ?  Failed - HGB in normal range and within 360 days  ?  Hemoglobin  ?Date Value Ref Range Status  ?10/11/2018 16.2 13.0 - 17.7 g/dL Final  ?  ?  ?  ?  Failed - HCT in normal range and within 360 days  ?  Hematocrit  ?Date Value Ref Range Status  ?10/11/2018 45.8 37.5 - 51.0 % Final  ?  ?  ?  ?  Failed - AST in normal range and within 360 days  ?  AST  ?Date Value Ref Range Status  ?12/03/2020 57 (H) 0 - 40 IU/L Final  ?  ?  ?  ?  Failed - ALT in normal range and within 360 days  ?  ALT  ?Date Value Ref Range Status  ?12/03/2020 98 (H) 0 - 44 IU/L Final  ?  ?  ?  ?  Passed - Cr in normal range and within 360 days  ?  Creatinine, Ser  ?Date Value Ref Range Status  ?12/03/2020 1.07 0.76 - 1.27 mg/dL Final  ?  ?  ?  ?  Passed - eGFR is 30 or above and within 360 days  ?  GFR calc Af Amer  ?Date Value Ref Range Status  ?10/11/2018 98 >59 mL/min/1.73 Final  ? ?GFR calc non Af Amer  ?Date Value Ref Range Status  ?10/11/2018 85 >59 mL/min/1.73 Final  ? ?eGFR  ?Date Value Ref Range Status  ?12/03/2020 93 >59 mL/min/1.73 Final  ?  ?  ?  ?  Passed - Patient is not pregnant  ?  ?  Passed - Valid encounter within last 12 months  ?  Recent Outpatient Visits   ? ?      ? 3 weeks ago Upper respiratory tract infection, unspecified type  ?  [facility] Thedore Mins, Ria Comment, PA-C  ? 2 months ago Acute leg pain, right  ? [place], PA-C  ? 3 months ago Viral URI  ? [place], DO  ? 4 months ago Panic disorder  ? [facility] Thedore Mins, Ria Comment, PA-C  ? 7 months ago Acute bacterial conjunctivitis of both eyes  ? [facility] Caryn Section, Kirstie Peri, MD  ? ?  ?  ? ?  ?  ?  ? ?

## 2021-04-28 ENCOUNTER — Other Ambulatory Visit: Payer: Self-pay | Admitting: Physician Assistant

## 2021-04-28 DIAGNOSIS — M79604 Pain in right leg: Secondary | ICD-10-CM

## 2021-04-28 NOTE — Telephone Encounter (Signed)
Requested Prescriptions  ?Pending Prescriptions Disp Refills  ?? meloxicam (MOBIC) 15 MG tablet [Pharmacy Med Name: Meloxicam 15 MG Oral Tablet] 30 tablet 0  ?  Sig: Take 1 tablet by mouth once daily  ?  ? Analgesics:  COX2 Inhibitors Failed - 04/28/2021 10:24 AM  ?  ?  Failed - Manual Review: Labs are only required if the patient has taken medication for more than 8 weeks.  ?  ?  Failed - HGB in normal range and within 360 days  ?  Hemoglobin  ?Date Value Ref Range Status  ?10/11/2018 16.2 13.0 - 17.7 g/dL Final  ?   ?  ?  Failed - HCT in normal range and within 360 days  ?  Hematocrit  ?Date Value Ref Range Status  ?10/11/2018 45.8 37.5 - 51.0 % Final  ?   ?  ?  Failed - AST in normal range and within 360 days  ?  AST  ?Date Value Ref Range Status  ?12/03/2020 57 (H) 0 - 40 IU/L Final  ?   ?  ?  Failed - ALT in normal range and within 360 days  ?  ALT  ?Date Value Ref Range Status  ?12/03/2020 98 (H) 0 - 44 IU/L Final  ?   ?  ?  Passed - Cr in normal range and within 360 days  ?  Creatinine, Ser  ?Date Value Ref Range Status  ?12/03/2020 1.07 0.76 - 1.27 mg/dL Final  ?   ?  ?  Passed - eGFR is 30 or above and within 360 days  ?  GFR calc Af Amer  ?Date Value Ref Range Status  ?10/11/2018 98 >59 mL/min/1.73 Final  ? ?GFR calc non Af Amer  ?Date Value Ref Range Status  ?10/11/2018 85 >59 mL/min/1.73 Final  ? ?eGFR  ?Date Value Ref Range Status  ?12/03/2020 93 >59 mL/min/1.73 Final  ?   ?  ?  Passed - Patient is not pregnant  ?  ?  Passed - Valid encounter within last 12 months  ?  Recent Outpatient Visits   ?      ? 4 weeks ago Upper respiratory tract infection, unspecified type  ? [facility] Thedore Mins, Ria Comment, PA-C  ? 2 months ago Acute leg pain, right  ? [place], PA-C  ? 3 months ago Viral URI  ? [place], DO  ? 4 months ago Panic disorder  ? [facility] Thedore Mins, Ria Comment, PA-C  ? 7 months ago Acute bacterial  conjunctivitis of both eyes  ? [facility] Caryn Section, Kirstie Peri, MD  ?  ?  ? ?  ?  ?  ? ?

## 2021-06-10 ENCOUNTER — Other Ambulatory Visit: Payer: Self-pay | Admitting: Physician Assistant

## 2021-06-10 DIAGNOSIS — F410 Panic disorder [episodic paroxysmal anxiety] without agoraphobia: Secondary | ICD-10-CM

## 2021-06-10 NOTE — Telephone Encounter (Signed)
Medication Refill - Medication: LORazepam (ATIVAN) 0.5 MG tablet   Has the patient contacted their pharmacy? No. Pt says that he was told to contact PCP office to request refill.    (Agent: If no, request that the patient contact the pharmacy for the refill. If patient does not wish to contact the pharmacy document the reason why and proceed with request.) (Agent: If yes, when and what did the pharmacy advise?)  Preferred Pharmacy (with phone number or street name):  [facility] [address] [place], [place] - [address] Phone:  [phone]  Fax:  [phone]     Has the patient been seen for an appointment in the last year OR does the patient have an upcoming appointment? No.  Agent: Please be advised that RX refills may take up to 3 business days. We ask that you follow-up with your pharmacy.

## 2021-06-14 MED ORDER — LORAZEPAM 0.5 MG PO TABS: 0.5000 mg | ORAL_TABLET | Freq: Three times a day (TID) | ORAL | 0 refills | Status: DC | PRN

## 2021-06-14 NOTE — Telephone Encounter (Signed)
Requested medication (s) are due for refill today: Yes  Requested medication (s) are on the active medication list: Yes  Last refill:  03/30/21  Future visit scheduled: No  Notes to clinic:  See request.    Requested Prescriptions  Pending Prescriptions Disp Refills   LORazepam (ATIVAN) 0.5 MG tablet 30 tablet 0    Sig: Take 1 tablet (0.5 mg total) by mouth every 8 (eight) hours as needed (for severe anxiety).     Not Delegated - Psychiatry: Anxiolytics/Hypnotics 2 Failed - 06/10/2021  1:15 PM      Failed - This refill cannot be delegated      Failed - Urine Drug Screen completed in last 360 days      Passed - Patient is not pregnant      Passed - Valid encounter within last 6 months    Recent Outpatient Visits           2 months ago Upper respiratory tract infection, unspecified type   [facility] [place], [place], PA-C   4 months ago Acute leg pain, right   [facility], Oswaldo Conroy, PA-C   4 months ago Viral URI   [facility] Caro [place], DO   6 months ago Panic disorder   [facility] [place], [place], PA-C   9 months ago Acute bacterial conjunctivitis of both eyes   [facility] Malva Limes, MD

## 2021-06-29 ENCOUNTER — Ambulatory Visit: Payer: 59 | Admitting: Physician Assistant

## 2021-07-15 ENCOUNTER — Ambulatory Visit: Payer: 59 | Admitting: Physician Assistant

## 2021-07-15 NOTE — Progress Notes (Deleted)
    I,Jason Stanton,acting as a Neurosurgeon for [employer], PA-C.,have documented all relevant documentation on the behalf of Jason Ferguson, PA-C,as directed by  Jason Ferguson, PA-C while in the presence of Jason Ferguson, PA-C.   Acute Office Visit  Subjective:     Patient ID: Jason Stanton, male    DOB: November 19, 1985, 36 y.o.   MRN: [id number]  No chief complaint on file.   HPI Patient is in today for cyst on right kidney  ROS      Objective:    There were no vitals taken for this visit. {Vitals History (Optional):23777}  Physical Exam  No results found for any visits on 07/15/21.      Assessment & Plan:   Problem List Items Addressed This Visit   None   No orders of the defined types were placed in this encounter.   No follow-ups on file.  Jason Stanton, CMA

## 2021-09-27 ENCOUNTER — Other Ambulatory Visit: Payer: Self-pay | Admitting: Physician Assistant

## 2021-09-27 DIAGNOSIS — I10 Essential (primary) hypertension: Secondary | ICD-10-CM

## 2021-09-27 NOTE — Telephone Encounter (Signed)
Pt stated he took his last blood pressure pill today  Medication Refill - Medication: amLODipine-benazepril (LOTREL) 5-10 MG capsule  Has the patient contacted their pharmacy? Yes.   Pt advised no Rx on file  Preferred Pharmacy (with phone number or street name):  [facility] [address] [place], [place] - [address] Phone:  [phone]  Fax:  [phone]     Has the patient been seen for an appointment in the last year OR does the patient have an upcoming appointment? Yes.    Agent: Please be advised that RX refills may take up to 3 business days. We ask that you follow-up with your pharmacy.

## 2021-09-28 MED ORDER — AMLODIPINE BESY-BENAZEPRIL HCL 5-10 MG PO CAPS: 1.0000 | ORAL_CAPSULE | Freq: Every day | ORAL | 0 refills | Status: DC

## 2021-09-28 NOTE — Telephone Encounter (Signed)
Requested Prescriptions  Pending Prescriptions Disp Refills  . amLODipine-benazepril (LOTREL) 5-10 MG capsule 90 capsule 0    Sig: Take 1 capsule by mouth daily. OFFICE VISIT NEEDED FOR ADDITIONAL REFILLS     Cardiovascular: CCB + ACEI Combos Failed - 09/27/2021 11:49 AM      Failed - Cr in normal range and within 180 days    Creatinine, Ser  Date Value Ref Range Status  12/03/2020 1.07 0.76 - 1.27 mg/dL Final         Failed - K in normal range and within 180 days    Potassium  Date Value Ref Range Status  12/03/2020 4.5 3.5 - 5.2 mmol/L Final         Failed - Na in normal range and within 180 days    Sodium  Date Value Ref Range Status  12/03/2020 139 134 - 144 mmol/L Final         Failed - eGFR is 30 or above and within 180 days    GFR calc Af Amer  Date Value Ref Range Status  10/11/2018 98 >59 mL/min/1.73 Final   GFR calc non Af Amer  Date Value Ref Range Status  10/11/2018 85 >59 mL/min/1.73 Final   eGFR  Date Value Ref Range Status  12/03/2020 93 >59 mL/min/1.73 Final         Failed - Last BP in normal range    BP Readings from Last 1 Encounters:  03/30/21 (!) 125/93         Failed - Valid encounter within last 6 months    Recent Outpatient Visits          6 months ago Upper respiratory tract infection, unspecified type   [facility] Thedore Mins, [place], PA-C   7 months ago Acute leg pain, right   [employer], Dani Gobble, PA-C   8 months ago Viral URI   [facility] Myles Gip, DO   9 months ago Panic disorder   [facility] Thedore Mins, [place], PA-C   1 year ago Acute bacterial conjunctivitis of both eyes   [facility] Birdie Sons, MD      Future Appointments            In 1 month [name], PA-C [facility] [place] - Patient is not pregnant

## 2021-11-18 ENCOUNTER — Encounter: Payer: Self-pay | Admitting: Physician Assistant

## 2021-11-18 ENCOUNTER — Telehealth (INDEPENDENT_AMBULATORY_CARE_PROVIDER_SITE_OTHER): Payer: 59 | Admitting: Physician Assistant

## 2021-11-18 DIAGNOSIS — F410 Panic disorder [episodic paroxysmal anxiety] without agoraphobia: Secondary | ICD-10-CM | POA: Diagnosis not present

## 2021-11-18 DIAGNOSIS — I10 Essential (primary) hypertension: Secondary | ICD-10-CM

## 2021-11-18 MED ORDER — LORAZEPAM 0.5 MG PO TABS: 0.5000 mg | ORAL_TABLET | Freq: Three times a day (TID) | ORAL | 1 refills | Status: DC | PRN

## 2021-11-18 NOTE — Assessment & Plan Note (Signed)
Advised instead of alternating every two days-- will rx a lower dose, amlodipine 2.5 mg - benazepril 10 mg.

## 2021-11-18 NOTE — Assessment & Plan Note (Signed)
Stable, takes ativan 0.5 mg prn panic attacks Refilled today.

## 2021-11-18 NOTE — Progress Notes (Unsigned)
MyChart Video Visit    Virtual Visit via Video Note   This visit type was conducted due to national recommendations for restrictions regarding the COVID-19 Pandemic (e.g. social distancing) in an effort to limit this patient's exposure and mitigate transmission in our community. This patient is at least at moderate risk for complications without adequate follow up. This format is felt to be most appropriate for this patient at this time. Physical exam was limited by quality of the video and audio technology used for the visit.   Patient location: home Provider location: [facility]  I discussed the limitations of evaluation and management by telemedicine and the availability of in person appointments. The patient expressed understanding and agreed to proceed.  Patient: Jason Stanton   DOB: 03-Nov-1985   36 y.o. Male  MRN: [id number] Visit Date: 11/18/2021  Today's healthcare provider: [name], PA-C   Cc. Anxiety, htn  Subjective    HPI   Anxiety/Panic Attacks -Much improved. Depending on life-stress, may use benzo for panic attack once a month for twice a week. Describes an ongoing stressful situation.  -Exercising more.   Hypertension, follow-up  BP Readings from Last 3 Encounters:  03/30/21 (!) 125/93  02/04/21 114/76  12/03/20 122/84   Wt Readings from Last 3 Encounters:  03/30/21 240 lb 3.2 oz (109 kg)  02/04/21 243 lb (110.2 kg)  12/03/20 246 lb 3.2 oz (111.7 kg)     He was last seen for hypertension 1 years ago.  BP at that visit was 122/84. Management since that visit includes continue current treatment.  Pt reports exercising more and trying to wean himself off BP meds. He has been taking 1 pill for two days in a row, and then skipping two days. Pertinent labs Lab Results  Component Value Date   CHOL 160 10/11/2018   HDL 59 10/11/2018   LDLCALC 82 10/11/2018   TRIG 107 10/11/2018   CHOLHDL 2.7 10/11/2018   Lab Results   Component Value Date   NA 139 12/03/2020   K 4.5 12/03/2020   CREATININE 1.07 12/03/2020   EGFR 93 12/03/2020   GLUCOSE 85 12/03/2020   TSH 2.070 10/11/2018     The ASCVD Risk score (Arnett DK, et al., 2019) failed to calculate for the following reasons:   The 2019 ASCVD risk score is only valid for ages 74 to 77  ---------------------------------------------------------------------------------------------------    Medications: Outpatient Medications Prior to Visit  Medication Sig   meloxicam (MOBIC) 15 MG tablet Take 1 tablet by mouth once daily   methylPREDNISolone (MEDROL DOSEPAK) 4 MG TBPK tablet Take all 6 pills on day 1 in AM, 5 pills on day 2, 4 pills on day 3, 3 pills on day 4, 2 pills on 5 day, and 1 pill on day 6.   [DISCONTINUED] amLODipine-benazepril (LOTREL) 5-10 MG capsule Take 1 capsule by mouth daily. OFFICE VISIT NEEDED FOR ADDITIONAL REFILLS   [DISCONTINUED] LORazepam (ATIVAN) 0.5 MG tablet Take 1 tablet (0.5 mg total) by mouth every 8 (eight) hours as needed (for severe anxiety).   No facility-administered medications prior to visit.    Review of Systems  Constitutional:  Negative for fatigue and fever.  Respiratory:  Negative for cough and shortness of breath.   Cardiovascular:  Negative for chest pain, palpitations and leg swelling.  Neurological:  Negative for dizziness and headaches.       Objective    There were no vitals taken for this visit.  Physical Exam Constitutional:      Appearance: He is not ill-appearing.  Neurological:     Mental Status: He is oriented to person, place, and time.  Psychiatric:        Mood and Affect: Mood normal.        Behavior: Behavior normal.        Assessment & Plan     Problem List Items Addressed This Visit       Cardiovascular and Mediastinum   Essential hypertension - Primary    Advised instead of alternating every two days-- will rx a lower dose, amlodipine 2.5 mg - benazepril 10 mg.            Other   Panic disorder    Stable, takes ativan 0.5 mg prn panic attacks Refilled today.      Relevant Medications   LORazepam (ATIVAN) 0.5 MG tablet    Return in about 4 months (around 03/19/2022) for CPE.     I discussed the assessment and treatment plan with the patient. The patient was provided an opportunity to ask questions and all were answered. The patient agreed with the plan and demonstrated an understanding of the instructions.   The patient was advised to call back or seek an in-person evaluation if the symptoms worsen or if the condition fails to improve as anticipated.  I provided 10 minutes of non-face-to-face time during this encounter.  I, [name], PA-C have reviewed all documentation for this visit. The documentation on  11/18/2021 for the exam, diagnosis, procedures, and orders are all accurate and complete.  [name], PA-C [facility] [address] [place], [place], [address] Office: [phone] Fax: [place]

## 2021-11-18 NOTE — Progress Notes (Deleted)
      Established patient visit   Patient: Jason Stanton   DOB: [birth year]   36 y.o. Male  MRN: [id number] Visit Date: 11/18/2021  Today's healthcare provider: [name], PA-C   No chief complaint on file.  Subjective    HPI  Follow up for Panic disorder:  The patient was last seen for this 8 months ago. Changes made at last visit include; Stable.  He reports {excellent/good/fair/poor:19665} compliance with treatment. He feels that condition is {improved/worse/unchanged:3041574}. He {is/is not:21021397} having side effects. ***  -----------------------------------------------------------------------------------------   Medications: Outpatient Medications Prior to Visit  Medication Sig   amLODipine-benazepril (LOTREL) 5-10 MG capsule Take 1 capsule by mouth daily. OFFICE VISIT NEEDED FOR ADDITIONAL REFILLS   LORazepam (ATIVAN) 0.5 MG tablet Take 1 tablet (0.5 mg total) by mouth every 8 (eight) hours as needed (for severe anxiety).   meloxicam (MOBIC) 15 MG tablet Take 1 tablet by mouth once daily   methylPREDNISolone (MEDROL DOSEPAK) 4 MG TBPK tablet Take all 6 pills on day 1 in AM, 5 pills on day 2, 4 pills on day 3, 3 pills on day 4, 2 pills on 5 day, and 1 pill on day 6.   No facility-administered medications prior to visit.    Review of Systems  Constitutional:  Negative for appetite change, chills and fever.  Respiratory:  Negative for chest tightness, shortness of breath and wheezing.   Cardiovascular:  Negative for chest pain and palpitations.  Gastrointestinal:  Negative for abdominal pain, nausea and vomiting.    {Labs  Heme  Chem  Endocrine  Serology  Results Review (optional):23779}   Objective    There were no vitals taken for this visit. {Show previous vital signs (optional):23777}  Physical Exam  ***  No results found for any visits on 11/18/21.  Assessment & Plan     ***  No follow-ups on file.      {provider  attestation***:1}   [name], PA-C  [facility] [phone] (phone) [phone] (fax)  [place]

## 2021-11-22 MED ORDER — AMLODIPINE BESY-BENAZEPRIL HCL 2.5-10 MG PO CAPS: 1.0000 | ORAL_CAPSULE | Freq: Every day | ORAL | 1 refills | Status: DC

## 2022-02-08 ENCOUNTER — Encounter: Payer: Self-pay | Admitting: Physician Assistant

## 2022-03-13 ENCOUNTER — Ambulatory Visit: Payer: Self-pay

## 2022-03-13 ENCOUNTER — Telehealth (INDEPENDENT_AMBULATORY_CARE_PROVIDER_SITE_OTHER): Payer: 59 | Admitting: Physician Assistant

## 2022-03-13 ENCOUNTER — Encounter: Payer: Self-pay | Admitting: Physician Assistant

## 2022-03-13 VITALS — Temp 99.0°F | Ht 72.0 in | Wt 235.0 lb

## 2022-03-13 DIAGNOSIS — U071 COVID-19: Secondary | ICD-10-CM | POA: Diagnosis not present

## 2022-03-13 LAB — POC COVID19 BINAXNOW: SARS Coronavirus 2 Ag: POSITIVE — AB

## 2022-03-13 MED ORDER — NIRMATRELVIR/RITONAVIR (PAXLOVID)TABLET: 3.0000 | ORAL_TABLET | Freq: Two times a day (BID) | ORAL | 0 refills | Status: DC

## 2022-03-13 MED ORDER — MOLNUPIRAVIR EUA 200MG CAPSULE: 4.0000 | ORAL_CAPSULE | Freq: Two times a day (BID) | ORAL | 0 refills | Status: AC

## 2022-03-13 NOTE — Telephone Encounter (Signed)
  Chief Complaint: COVID+ - HA Symptoms: HA, BA, Chills and sweating, chest heaviness Frequency: Yesterday s/s started - tested + today Pertinent Negatives: Patient denies Fever - chest pain Disposition: []$ ED /[]$ Urgent Care (no appt availability in office) / [x]$ Appointment(In office/virtual)/ []$  [place] [facility]/ []$ Home Care/ []$ Refused Recommended Disposition /[]$ [place] [facility]/ []$  Follow-up with PCP Additional Notes: PT started to feel poorly yesterday and tested+ for COVID today. Pt states that worst symptom is HA. Pt has taken Tylenol, Dayquil and nightquil with out relief. Pt states he has chest heaviness. Pt refused ED for chest. Pt would like VV with provider.  Reason for Disposition  [1] HIGH RISK patient (e.g., weak immune system, age > 24 years, obesity with BMI 30 or higher, pregnant, chronic lung disease or other chronic medical condition) AND [2] COVID symptoms (e.g., cough, fever)  (Exceptions: Already seen by PCP and no new or worsening symptoms.)  Answer Assessment - Initial Assessment Questions 1. COVID-19 DIAGNOSIS: "How do you know that you have COVID?" (e.g., positive lab test or self-test, diagnosed by doctor or NP/PA, symptoms after exposure).    Teste this morning  2. COVID-19 EXPOSURE: "Was there any known exposure to COVID before the symptoms began?" CDC Definition of close contact: within 6 feet (2 meters) for a total of 15 minutes or more over a 24-hour period.      No - With a group Friday 3. ONSET: "When did the COVID-19 symptoms start?"      yesterday 4. WORST SYMPTOM: "What is your worst symptom?" (e.g., cough, fever, shortness of breath, muscle aches)     HA, BA, Hot and cold, Sore throat  Pt states that HA is the worst 5. COUGH: "Do you have a cough?" If Yes, ask: "How bad is the cough?"        6. FEVER: "Do you have a fever?" If Yes, ask: "What is your temperature, how was it measured, and when did it start?"     No 7. RESPIRATORY STATUS:  "Describe your breathing?" (e.g., normal; shortness of breath, wheezing, unable to speak)      Chest heaviness 8. BETTER-SAME-WORSE: "Are you getting better, staying the same or getting worse compared to yesterday?"  If getting worse, ask, "In what way?"     Worse  9. OTHER SYMPTOMS: "Do you have any other symptoms?"  (e.g., chills, fatigue, headache, loss of smell or taste, muscle pain, sore throat)     Chills and sweats, Chest feels heavy, BA 10. HIGH RISK DISEASE: "Do you have any chronic medical problems?" (e.g., asthma, heart or lung disease, weak immune system, obesity, etc.)       NO 11. VACCINE: "Have you had the COVID-19 vaccine?" If Yes, ask: "Which one, how many shots, when did you get it?"       yes  Protocols used: Coronavirus (COVID-19) Diagnosed or Suspected-A-AH

## 2022-03-13 NOTE — Progress Notes (Signed)
    I,[name],acting as a Education administrator for [employer], PA-C.,have documented all relevant documentation on the behalf of Jason Kirschner, PA-C,as directed by  Jason Kirschner, PA-C while in the presence of Jason Kirschner, PA-C.  MyChart Video Visit    Virtual Visit via Video Note   This format is felt to be most appropriate for this patient at this time. Physical exam was limited by quality of the video and audio technology used for the visit.   Patient location: home Provider location: [facility]  I discussed the limitations of evaluation and management by telemedicine and the availability of in person appointments. The patient expressed understanding and agreed to proceed.  Patient: Jason Stanton   DOB: [birth year]   37 y.o. Male  MRN: [id number] Visit Date: 03/13/2022  Today's healthcare provider: [name], PA-C   Cc. Covid positive  Subjective    HPI  Pt reports headaches, body aches, chills, sore throat, malaise x 1 day. Reports positive covid test today at home. He has been taking tylenol and dayquil with no relief. Pt reports his work required an in-office covid test to allow him to stay out of work.   Medications: Outpatient Medications Prior to Visit  Medication Sig   amlodipine-benazepril (LOTREL) 2.5-10 MG capsule Take 1 capsule by mouth daily.   LORazepam (ATIVAN) 0.5 MG tablet Take 1 tablet (0.5 mg total) by mouth every 8 (eight) hours as needed (for severe anxiety).   meloxicam (MOBIC) 15 MG tablet Take 1 tablet by mouth once daily   methylPREDNISolone (MEDROL DOSEPAK) 4 MG TBPK tablet Take all 6 pills on day 1 in AM, 5 pills on day 2, 4 pills on day 3, 3 pills on day 4, 2 pills on 5 day, and 1 pill on day 6. (Patient not taking: Reported on 03/13/2022)   No facility-administered medications prior to visit.      Objective    Temp 99 F (37.2 C) (Temporal)   Ht 6' (1.829 m)   Wt 235 lb (106.6 kg)   BMI 31.87 kg/m    Physical  Exam Constitutional:      Appearance: He is not toxic-appearing.  Neurological:     Mental Status: He is oriented to person, place, and time.        Assessment & Plan     COVID 19 Given HTN, tx with molnupriativr antiviral. Pt not eligible for paxlovid given last gfr 2022 and declines labs today.  Increase fluids, alternate with tylneol and ibuprofen.   Pt needs to come to office for testing  -poc COVID 19 positive   Return if symptoms worsen or fail to improve.    I discussed the assessment and treatment plan with the patient. The patient was provided an opportunity to ask questions and all were answered. The patient agreed with the plan and demonstrated an understanding of the instructions.   The patient was advised to call back or seek an in-person evaluation if the symptoms worsen or if the condition fails to improve as anticipated.  I provided 5 minutes of non-face-to-face time during this encounter.  I, Jason Kirschner, PA-C have reviewed all documentation for this visit. The documentation on  03/13/22  for the exam, diagnosis, procedures, and orders are all accurate and complete.  Jason Kirschner, PA-C [facility] [address] [place], [place], [address] Office: [phone] Fax: [place]

## 2022-04-18 ENCOUNTER — Other Ambulatory Visit: Payer: Self-pay | Admitting: Physician Assistant

## 2022-04-18 DIAGNOSIS — F410 Panic disorder [episodic paroxysmal anxiety] without agoraphobia: Secondary | ICD-10-CM

## 2022-04-18 NOTE — Telephone Encounter (Signed)
Medication Refill - Medication: LORazepam (ATIVAN) 0.5 MG tablet [id number]   Has the patient contacted their pharmacy? Yes.    (Agent: If yes, when and what did the pharmacy advise?) Contact PCP   Preferred Pharmacy (with phone number or street name): [facility], [place]   Has the patient been seen for an appointment in the last year OR does the patient have an upcoming appointment? Yes.    Agent: Please be advised that RX refills may take up to 3 business days. We ask that you follow-up with your pharmacy.   Pt is out of medication

## 2022-04-18 NOTE — Telephone Encounter (Signed)
Requested medication (s) are due for refill today: yes  Requested medication (s) are on the active medication list: yes  Last refill:  11/18/21  Future visit scheduled: yes  Notes to clinic:  Unable to refill per protocol, cannot delegate.      Requested Prescriptions  Pending Prescriptions Disp Refills   LORazepam (ATIVAN) 0.5 MG tablet 30 tablet 1    Sig: Take 1 tablet (0.5 mg total) by mouth every 8 (eight) hours as needed (for severe anxiety).     Not Delegated - Psychiatry: Anxiolytics/Hypnotics 2 Failed - 04/18/2022 10:29 AM      Failed - This refill cannot be delegated      Failed - Urine Drug Screen completed in last 360 days      Passed - Patient is not pregnant      Passed - Valid encounter within last 6 months    Recent Outpatient Visits           1 month ago [place] [name], PA-C   5 months ago Essential hypertension   [place] [name], PA-C   1 year ago Upper respiratory tract infection, unspecified type   [place] [name], PA-C   1 year ago Acute leg pain, right   [place], Dani Gobble, PA-C   1 year ago Viral URI   [place], [place], [place]

## 2022-04-19 MED ORDER — LORAZEPAM 0.5 MG PO TABS: 0.5000 mg | ORAL_TABLET | Freq: Three times a day (TID) | ORAL | 1 refills | Status: DC | PRN

## 2022-07-14 ENCOUNTER — Telehealth: Payer: Self-pay | Admitting: Physician Assistant

## 2022-07-14 ENCOUNTER — Ambulatory Visit: Payer: 59 | Admitting: Physician Assistant

## 2022-07-14 VITALS — BP 135/89 | HR 89 | Ht 72.0 in | Wt 250.0 lb

## 2022-07-14 DIAGNOSIS — I10 Essential (primary) hypertension: Secondary | ICD-10-CM | POA: Diagnosis not present

## 2022-07-14 DIAGNOSIS — Z6833 Body mass index (BMI) 33.0-33.9, adult: Secondary | ICD-10-CM | POA: Diagnosis not present

## 2022-07-14 DIAGNOSIS — E66811 Obesity, class 1: Secondary | ICD-10-CM | POA: Insufficient documentation

## 2022-07-14 DIAGNOSIS — Z789 Other specified health status: Secondary | ICD-10-CM

## 2022-07-14 DIAGNOSIS — K219 Gastro-esophageal reflux disease without esophagitis: Secondary | ICD-10-CM | POA: Diagnosis not present

## 2022-07-14 DIAGNOSIS — Z6829 Body mass index (BMI) 29.0-29.9, adult: Secondary | ICD-10-CM | POA: Insufficient documentation

## 2022-07-14 MED ORDER — OMEPRAZOLE 20 MG PO CPDR
20.0000 mg | DELAYED_RELEASE_CAPSULE | Freq: Every day | ORAL | 3 refills | Status: AC
Start: 2022-07-14 — End: ?

## 2022-07-14 MED ORDER — ZEPBOUND 2.5 MG/0.5ML ~~LOC~~ SOAJ
2.5000 mg | SUBCUTANEOUS | 0 refills | Status: DC
Start: 2022-07-14 — End: 2023-01-04

## 2022-07-14 MED ORDER — AMLODIPINE BESY-BENAZEPRIL HCL 2.5-10 MG PO CAPS: 1.0000 | ORAL_CAPSULE | Freq: Every day | ORAL | 3 refills | Status: DC

## 2022-07-14 NOTE — Telephone Encounter (Signed)
[employer] is requesting prior authorization Key: [id number] Name: Jason Stanton 2.5mg /0.30ml auto injectors

## 2022-07-14 NOTE — Progress Notes (Signed)
Established patient visit   Patient: Jason Stanton   DOB: [birth year]   36 y.o. Male  MRN: [id number] Visit Date: 07/14/2022  Today's healthcare provider: [name], PA-C  Cc. Htn f.u Subjective    HPI  Hypertension, follow-up  BP Readings from Last 3 Encounters:  07/14/22 135/89  03/30/21 (!) 125/93  02/04/21 114/76   Wt Readings from Last 3 Encounters:  07/14/22 250 lb (113.4 kg)  03/13/22 235 lb (106.6 kg)  03/30/21 240 lb 3.2 oz (109 kg)      Lab Results  Component Value Date   CHOL 160 10/11/2018   HDL 59 10/11/2018   LDLCALC 82 10/11/2018   TRIG 107 10/11/2018   CHOLHDL 2.7 10/11/2018   Lab Results  Component Value Date   NA 139 12/03/2020   K 4.5 12/03/2020   CREATININE 1.07 12/03/2020   EGFR 93 12/03/2020   GLUCOSE 85 12/03/2020   TSH 2.070 10/11/2018     The ASCVD Risk score (Arnett DK, et al., 2019) failed to calculate for the following reasons:   The 2019 ASCVD risk score is only valid for ages 36 to 7  ---------------------------------------------------------------------------------------------------   Discussed the use of AI scribe software for clinical note transcription with the patient, who gave verbal consent to proceed.  History of Present Illness   The patient, with a history of high blood pressure, presents with concerns about weight loss. The patient has been trying to lose weight through diet and exercise, but has been unsuccessful. The patient reports experiencing joint pain and increased snoring, which he attributes to his weight. The patient has a family history of using weight loss medication and has considered it as an option. He does admit to a fair amount of alcohol consumption and is trying to cut down on his weekly intake. he is trying not to drink hard liquor (whiskey) during the week. He reports recently being on vacation and drinking more. Typically 21+ drinks a week, but for the last several months trying to cut  down to 6-10.    The patient also reports experiencing acid reflux, which he manages with TUMs.      Medications: Outpatient Medications Prior to Visit  Medication Sig   LORazepam (ATIVAN) 0.5 MG tablet Take 1 tablet (0.5 mg total) by mouth every 8 (eight) hours as needed (for severe anxiety).   meloxicam (MOBIC) 15 MG tablet Take 1 tablet by mouth once daily   [DISCONTINUED] amlodipine-benazepril (LOTREL) 2.5-10 MG capsule Take 1 capsule by mouth daily.   [DISCONTINUED] methylPREDNISolone (MEDROL DOSEPAK) 4 MG TBPK tablet Take all 6 pills on day 1 in AM, 5 pills on day 2, 4 pills on day 3, 3 pills on day 4, 2 pills on 5 day, and 1 pill on day 6. (Patient not taking: Reported on 07/14/2022)   No facility-administered medications prior to visit.   Review of Systems  Constitutional:  Negative for fatigue and fever.  Respiratory:  Negative for cough and shortness of breath.   Cardiovascular:  Negative for chest pain, palpitations and leg swelling.  Neurological:  Negative for dizziness and headaches.      Objective    BP 135/89 (BP Location: Left Arm, Patient Position: Sitting, Cuff Size: Large)   Pulse 89   Ht 6' (1.829 m)   Wt 250 lb (113.4 kg)   SpO2 100%   BMI 33.91 kg/m  Physical Exam Vitals reviewed.  Constitutional:      Appearance: He is  not ill-appearing.  HENT:     Head: Normocephalic.  Eyes:     Conjunctiva/sclera: Conjunctivae normal.  Cardiovascular:     Rate and Rhythm: Normal rate.  Pulmonary:     Effort: Pulmonary effort is normal. No respiratory distress.  Neurological:     General: No focal deficit present.     Mental Status: He is alert and oriented to person, place, and time.  Psychiatric:        Mood and Affect: Mood normal.        Behavior: Behavior normal.      No results found for any visits on 07/14/22.  Assessment & Plan     Problem List Items Addressed This Visit       Cardiovascular and Mediastinum   Essential hypertension - Primary     Cont amlodipine 2.5 mg benazepril 10 mg  Bp moderately controlled F/u 6 mo       Relevant Medications   amlodipine-benazepril (LOTREL) 2.5-10 MG capsule   Other Relevant Orders   CBC w/Diff/Platelet     Other   BMI 33.0-33.9,adult    Patient reports difficulty losing weight despite exercise and dietary changes. Discussed options for weight loss medications.  -Start Zepbound 2.5mg  injection once weekly, increasing to 5mg  after 4 weeks. -Check A1C       Relevant Medications   tirzepatide (ZEPBOUND) 2.5 MG/0.5ML Pen   Other Relevant Orders   HgB A1c   Other Visit Diagnoses     Gastroesophageal reflux disease, unspecified whether esophagitis present       Relevant Medications   omeprazole (PRILOSEC) 20 MG capsule   Alcohol use          Gastroesophageal Reflux Disease (GERD): Patient reports frequent heartburn, currently using over-the-counter antacids. Discussed the use of proton pump inhibitors (PPIs) for symptom control. -Start Omeprazole for 6-8 weeks, then as needed for symptom control.  Alcohol Consumption: Patient reports regular alcohol consumption, including beer and whiskey. Discussed the potential impact on weight and overall health. -Encourage moderation in alcohol consumption.        Return in about 3 months (around 10/14/2022) for obesity.      I, [name], PA-C have reviewed all documentation for this visit. The documentation on  07/14/22   for the exam, diagnosis, procedures, and orders are all accurate and complete.  [name], PA-C [facility] [address] [place], [place], [address] Office: [phone] Fax: [phone]   [facility]

## 2022-07-14 NOTE — Assessment & Plan Note (Signed)
Patient reports difficulty losing weight despite exercise and dietary changes. Discussed options for weight loss medications.  -Start Zepbound 2.5mg  injection once weekly, increasing to 5mg  after 4 weeks. -Check A1C

## 2022-07-14 NOTE — Assessment & Plan Note (Signed)
Cont amlodipine 2.5 mg benazepril 10 mg  Bp moderately controlled F/u 6 mo

## 2022-07-15 LAB — CBC WITH DIFFERENTIAL/PLATELET
Basophils Absolute: 0.1 10*3/uL (ref 0.0–0.2)
Basos: 1 %
EOS (ABSOLUTE): 0.3 10*3/uL (ref 0.0–0.4)
Eos: 3 %
Hematocrit: 44.4 % (ref 37.5–51.0)
Hemoglobin: 15.5 g/dL (ref 13.0–17.7)
Immature Grans (Abs): 0.1 10*3/uL (ref 0.0–0.1)
Immature Granulocytes: 1 %
Lymphocytes Absolute: 3.1 10*3/uL (ref 0.7–3.1)
Lymphs: 33 %
MCH: 32.5 pg (ref 26.6–33.0)
MCHC: 34.9 g/dL (ref 31.5–35.7)
MCV: 93 fL (ref 79–97)
Monocytes Absolute: 1 10*3/uL — ABNORMAL HIGH (ref 0.1–0.9)
Monocytes: 11 %
Neutrophils Absolute: 4.9 10*3/uL (ref 1.4–7.0)
Neutrophils: 51 %
Platelets: 221 10*3/uL (ref 150–450)
RBC: 4.77 x10E6/uL (ref 4.14–5.80)
RDW: 12.6 % (ref 11.6–15.4)
WBC: 9.4 10*3/uL (ref 3.4–10.8)

## 2022-07-15 LAB — COMPREHENSIVE METABOLIC PANEL
ALT: 135 IU/L — ABNORMAL HIGH (ref 0–44)
AST: 75 IU/L — ABNORMAL HIGH (ref 0–40)
Albumin: 4.7 g/dL (ref 4.1–5.1)
Alkaline Phosphatase: 69 IU/L (ref 44–121)
BUN/Creatinine Ratio: 13 (ref 9–20)
BUN: 14 mg/dL (ref 6–20)
Bilirubin Total: 0.3 mg/dL (ref 0.0–1.2)
CO2: 21 mmol/L (ref 20–29)
Calcium: 9.6 mg/dL (ref 8.7–10.2)
Chloride: 104 mmol/L (ref 96–106)
Creatinine, Ser: 1.1 mg/dL (ref 0.76–1.27)
Globulin, Total: 2.5 g/dL (ref 1.5–4.5)
Glucose: 87 mg/dL (ref 70–99)
Potassium: 4.5 mmol/L (ref 3.5–5.2)
Sodium: 140 mmol/L (ref 134–144)
Total Protein: 7.2 g/dL (ref 6.0–8.5)
eGFR: 90 mL/min/{1.73_m2} (ref >59)

## 2022-07-15 LAB — HEMOGLOBIN A1C
Est. average glucose Bld gHb Est-mCnc: 103 mg/dL
Hgb A1c MFr Bld: 5.2 % (ref 4.8–5.6)

## 2022-07-17 NOTE — Telephone Encounter (Signed)
Billy Fischer (Key: [id number]) PA Case ID #: [id number] Rx #: [id number] Status:Sent to Plan today Drug:Zepbound 2.5MG /0.5ML pen-injectors

## 2022-07-18 ENCOUNTER — Other Ambulatory Visit: Payer: Self-pay

## 2022-07-18 DIAGNOSIS — R748 Abnormal levels of other serum enzymes: Secondary | ICD-10-CM

## 2022-07-26 ENCOUNTER — Other Ambulatory Visit: Payer: Self-pay | Admitting: Physician Assistant

## 2022-07-26 DIAGNOSIS — Z6833 Body mass index (BMI) 33.0-33.9, adult: Secondary | ICD-10-CM

## 2022-07-26 MED ORDER — ZEPBOUND 5 MG/0.5ML ~~LOC~~ SOAJ
5.0000 mg | SUBCUTANEOUS | 1 refills | Status: DC
Start: 2022-08-09 — End: 2023-01-04

## 2022-07-26 NOTE — Telephone Encounter (Signed)
Approved. Sent in next dose.

## 2022-08-08 ENCOUNTER — Other Ambulatory Visit: Payer: Self-pay | Admitting: Physician Assistant

## 2022-08-08 DIAGNOSIS — Z6833 Body mass index (BMI) 33.0-33.9, adult: Secondary | ICD-10-CM

## 2022-08-09 NOTE — Telephone Encounter (Signed)
Requested medication (s) are due for refill today:yes  Requested medication (s) are on the active medication list: yes  Last refill:  07/14/22 2 ml  Future visit scheduled: yes  Notes to clinic:  med not assigned to a protocol   Requested Prescriptions  Pending Prescriptions Disp Refills   ZEPBOUND 2.5 MG/0.5ML Pen [Pharmacy Med Name: Zepbound 2.5 MG/0.5ML Subcutaneous Solution Auto-injector] 4 mL 0    Sig: INJECT 2.5 MG INTO THE SKIN ONCE A WEEK FOR 4 WEEKS     Off-Protocol Failed - 08/08/2022  7:58 AM      Failed - Medication not assigned to a protocol, review manually.      Passed - Valid encounter within last 12 months    Recent Outpatient Visits           3 weeks ago Essential hypertension   [facility] [name], PA-C   4 months ago COVID-19   [facility] [place], [place], PA-C   8 months ago Essential hypertension   [place] [facility] [name], PA-C   1 year ago Upper respiratory tract infection, unspecified type   [facility] [name], PA-C   1 year ago Acute leg pain, right    [facility] Mecum, Oswaldo Conroy, PA-C       Future Appointments             In 2 weeks Simmons-Robinson, Tawanna Cooler, MD [facility], [place]

## 2022-08-25 ENCOUNTER — Ambulatory Visit: Payer: 59 | Admitting: Family Medicine

## 2022-10-06 ENCOUNTER — Ambulatory Visit: Payer: 59 | Admitting: Family Medicine

## 2022-10-17 ENCOUNTER — Encounter (HOSPITAL_COMMUNITY): Payer: Self-pay

## 2022-10-17 ENCOUNTER — Ambulatory Visit (HOSPITAL_COMMUNITY)
Admission: EM | Admit: 2022-10-17 | Discharge: 2022-10-17 | Disposition: A | Discharge status: Home / Self Care | Payer: 59 | Attending: Family Medicine | Admitting: Family Medicine

## 2022-10-17 ENCOUNTER — Ambulatory Visit (INDEPENDENT_AMBULATORY_CARE_PROVIDER_SITE_OTHER): Payer: 59

## 2022-10-17 ENCOUNTER — Telehealth: Payer: 59 | Admitting: Physician Assistant

## 2022-10-17 DIAGNOSIS — R0789 Other chest pain: Secondary | ICD-10-CM | POA: Diagnosis not present

## 2022-10-17 DIAGNOSIS — M545 Low back pain, unspecified: Secondary | ICD-10-CM

## 2022-10-17 MED ORDER — DICLOFENAC SODIUM 75 MG PO TBEC: 75.0000 mg | DELAYED_RELEASE_TABLET | Freq: Two times a day (BID) | ORAL | 0 refills | Status: AC

## 2022-10-17 NOTE — Discharge Instructions (Signed)
You have been seen at the [facility] today for chest pain. Your evaluation today was not suggestive of any emergent condition requiring medical intervention at this time. Your chest x-ray and ECG (heart tracing) did not show any worrisome changes. However, some medical problems make take more time to appear. Therefore, it's very important that you pay attention to any new symptoms or worsening of your current condition.  Please proceed directly to the Emergency Department immediately should you feel worse in any way or have any of the following symptoms: increasing or different chest pain, pain that spreads to your arm, neck, jaw, back or abdomen, shortness of breath, or nausea and vomiting.  

## 2022-10-17 NOTE — ED Triage Notes (Signed)
Patient here today with c/o chest pain, right arm numbness and tingling X 2 hours. He woke up with a stiff neck yesterday morning. He is having some rib pain and hurts to take deep breathes. Patient does smoke. No known injury. He has a h/o HTN and has had panic attacks in the past. He took his BP (Amlodipine) med this morning.

## 2022-10-17 NOTE — Progress Notes (Signed)
Because you were seen in person today and evaluated for chest pain, as well as given treatment for this, I am going to recommend that you contact your primary care physician practice to be seen for further assessment/UC follow-up so they can determine next best steps in treatment. Many offices offer virtual options to be seen via video if you are not comfortable going in person to a medical facility at this time.  NOTE: You will NOT be charged for this eVisit.  If you do not have a PCP, [facility] offers a free physician referral service available at [phone]. Our trained staff has the experience, knowledge and resources to put you in touch with a physician who is right for you.    If you are having a true medical emergency please call 911.   Your e-visit answers were reviewed by a board certified advanced clinical practitioner to complete your personal care plan.  Thank you for using e-Visits.

## 2022-10-18 ENCOUNTER — Ambulatory Visit: Payer: 59 | Admitting: Physician Assistant

## 2022-10-18 ENCOUNTER — Encounter: Payer: Self-pay | Admitting: Physician Assistant

## 2022-10-18 VITALS — BP 115/73 | HR 96 | Resp 16 | Ht 72.0 in | Wt 225.0 lb

## 2022-10-18 DIAGNOSIS — M79601 Pain in right arm: Secondary | ICD-10-CM

## 2022-10-18 MED ORDER — GABAPENTIN 100 MG PO CAPS
100.0000 mg | ORAL_CAPSULE | Freq: Three times a day (TID) | ORAL | 3 refills | Status: DC
Start: 2022-10-18 — End: 2023-01-04

## 2022-10-18 MED ORDER — CYCLOBENZAPRINE HCL 5 MG PO TABS: 5.0000 mg | ORAL_TABLET | Freq: Three times a day (TID) | ORAL | 1 refills | Status: DC | PRN

## 2022-10-18 NOTE — ED Provider Notes (Signed)
[facility]   [id number] 10/17/22 Arrival Time: 1429  ASSESSMENT & PLAN:  1. Atypical chest pain   2. Chest wall pain    Patient history and exam consistent with non-cardiac cause of chest pain.  ECG: Performed today and interpreted by me: normal EKG, normal sinus rhythm.  I have personally viewed and independently interpreted the imaging studies ordered this visit. CXR: no acute changes; no PTX.  Trial of: Meds ordered this encounter  Medications   diclofenac (VOLTAREN) 75 MG EC tablet    Sig: Take 1 tablet (75 mg total) by mouth 2 (two) times daily.    Dispense:  14 tablet    Refill:  0   Work note provided. Reviewed expectations re: course of current medical issues. Questions answered. Outlined signs and symptoms indicating need for more acute intervention. Patient verbalized understanding. After Visit Summary given.   SUBJECTIVE:  History from: patient. Jason Stanton is a 37 y.o. male who presents with complaint of chest pain, right arm numbness and tingling X 2 hours. He woke up with a stiff neck yesterday morning. He is having some rib pain and hurts to take deep breathes. Patient does smoke. No known injury. He has a h/o HTN and has had panic attacks in the past. He took his BP (Amlodipine) med this morning. Pain in chest always related to certain movements.   Social History   Tobacco Use  Smoking Status Every Day   Current packs/day: 0.00   Average packs/day: 0.5 packs/day for 2.0 years (1.0 ttl pk-yrs)   Types: Cigarettes   Start date: 07/04/2018   Last attempt to quit: 07/03/2020   Years since quitting: 2.2  Smokeless Tobacco Never  Tobacco Comments   Stopped 5 months ago but still has urges   Social History   Substance and Sexual Activity  Alcohol Use Yes   Alcohol/week: 1.0 - 2.0 standard drink of alcohol   Types: 1 - 2 Cans of beer per week    OBJECTIVE:  Vitals:   10/17/22 1435 10/17/22 1437  BP:  (!) 136/98  Pulse:  77   Resp:  16  Temp:  98.2 F (36.8 C)  TempSrc:  Oral  SpO2:  100%  Weight: 103.4 kg   Height: 6' (1.829 m)     General appearance: alert, oriented, no acute distress Eyes: PERRLA; EOMI; conjunctivae normal HENT: normocephalic; atraumatic Neck: supple with FROM Lungs: without labored respirations; speaks full sentences without difficulty; CTAB Heart: regular rate and rhythm without murmer Chest Wall: without tenderness to palpation but reports R sided pain with arm movement Abdomen: soft, non-tender; no guarding or rebound tenderness Extremities: without edema; without calf swelling or tenderness; symmetrical without gross deformities Skin: warm and dry; without rash or lesions Neuro: normal gait Psychological: alert and cooperative; normal mood and affect   Imaging: DG Chest 2 View  Result Date: 10/17/2022 CLINICAL DATA:  Shortness of breath chest pain EXAM: CHEST - 2 VIEW COMPARISON:  07/21/2014 FINDINGS: The heart size and mediastinal contours are within normal limits. Both lungs are clear. The visualized skeletal structures are unremarkable. IMPRESSION: No active cardiopulmonary disease. Electronically Signed   By: Judie Petit.  Shick M.D.   On: 10/17/2022 16:56     Allergies  Allergen Reactions   Stadol [Butorphanol] Anaphylaxis    Past Medical History:  Diagnosis Date   Heart murmur    Social History   Socioeconomic History   Marital status: Married    Spouse name: Not on file  Number of children: Not on file   Years of education: Not on file   Highest education level: GED or equivalent  Occupational History   Not on file  Tobacco Use   Smoking status: Every Day    Current packs/day: 0.00    Average packs/day: 0.5 packs/day for 2.0 years (1.0 ttl pk-yrs)    Types: Cigarettes    Start date: 07/04/2018    Last attempt to quit: 07/03/2020    Years since quitting: 2.2   Smokeless tobacco: Never   Tobacco comments:    Stopped 5 months ago but still has urges  Vaping Use    Vaping status: Former  Substance and Sexual Activity   Alcohol use: Yes    Alcohol/week: 1.0 - 2.0 standard drink of alcohol    Types: 1 - 2 Cans of beer per week   Drug use: No   Sexual activity: Yes    Birth control/protection: Surgical    Comment: Vasectomy 09/12/2019  Other Topics Concern   Not on file  Social History Narrative   Not on file   Social Determinants of Health   Financial Resource Strain: Medium Risk (07/14/2022)   Overall Financial Resource Strain (CARDIA)    Difficulty of Paying Living Expenses: Somewhat hard  Food Insecurity: Food Insecurity Present (07/14/2022)   Hunger Vital Sign    Worried About Running Out of Food in the Last Year: Never true    Ran Out of Food in the Last Year: Often true  Transportation Needs: No Transportation Needs (07/14/2022)   PRAPARE - Administrator, Civil Service (Medical): No    Lack of Transportation (Non-Medical): No  Physical Activity: Sufficiently Active (07/14/2022)   Exercise Vital Sign    Days of Exercise per Week: 7 days    Minutes of Exercise per Session: 90 min  Stress: Stress Concern Present (07/14/2022)   [employer] of Occupational Health - Occupational Stress Questionnaire    Feeling of Stress : To some extent  Social Connections: Socially Integrated (07/14/2022)   Social Connection and Isolation Panel [NHANES]    Frequency of Communication with Friends and Family: More than three times a week    Frequency of Social Gatherings with Friends and Family: Twice a week    Attends Religious Services: More than 4 times per year    Active Member of [employer] or Organizations: Yes    Attends Banker Meetings: Patient declined    Marital Status: Married  Catering manager Violence: Not on file   Family History  Problem Relation Age of Onset   Cancer Mother    Past Surgical History:  Procedure Laterality Date   Peterson Lombard, MD 10/18/22 [phone]

## 2022-10-18 NOTE — Progress Notes (Signed)
Established patient visit  Patient: Jason Stanton   DOB: 10/26/1985   37 y.o. Male  MRN: [id number] Visit Date: 10/18/2022  Today's healthcare provider: Debera Lat, PA-C   Chief Complaint  Patient presents with   Neck Pain    X3 days, unable to move neck around   Shoulder Pain    R, x3 days   Subjective     Discussed the use of AI scribe software for clinical note transcription with the patient, who gave verbal consent to proceed.  History of Present Illness   The patient, a carpenter by profession, presents with severe neck pain that started after a golf game. The pain radiates to the shoulder, chest, and elbow. The patient reports difficulty in turning his head and experiences discomfort when sitting for long periods. He also reports a tingling sensation in his elbow. The patient has a history of a pinched nerve in his lower spine but has never experienced this level of discomfort in his neck before. The patient visited an urgent care center where an EKG was performed and found to be normal. He was prescribed an anti-inflammatory but reports no relief from the pain. He also reports difficulty sleeping due to the pain and has not eaten since the previous day due to the discomfort.           10/18/2022   10:41 AM 07/14/2022    1:11 PM 02/04/2021    2:07 PM  Depression screen PHQ 2/9  Decreased Interest 0 0 0  Down, Depressed, Hopeless 0 0 1  PHQ - 2 Score 0 0 1  Altered sleeping 0  3  Tired, decreased energy 0  2  Change in appetite 0  0  Feeling bad or failure about yourself  0  0  Trouble concentrating 0  0  Moving slowly or fidgety/restless 0  0  Suicidal thoughts 0  0  PHQ-9 Score 0  6  Difficult doing work/chores   Not difficult at all      12/03/2020    1:43 PM 12/02/2018    3:53 PM  GAD 7 : Generalized Anxiety Score  Nervous, Anxious, on Edge 2 3  Control/stop worrying 2 3  Worry too much - different things 2 2  Trouble relaxing 1 3  Restless 0 3   Easily annoyed or irritable 0 3  Afraid - awful might happen 1 3  Total GAD 7 Score 8 20  Anxiety Difficulty Somewhat difficult Very difficult    Medications: Outpatient Medications Prior to Visit  Medication Sig   amlodipine-benazepril (LOTREL) 2.5-10 MG capsule Take 1 capsule by mouth daily.   diclofenac (VOLTAREN) 75 MG EC tablet Take 1 tablet (75 mg total) by mouth 2 (two) times daily.   LORazepam (ATIVAN) 0.5 MG tablet Take 1 tablet (0.5 mg total) by mouth every 8 (eight) hours as needed (for severe anxiety).   tirzepatide (ZEPBOUND) 2.5 MG/0.5ML Pen Inject 2.5 mg into the skin once a week. For four weeks.   tirzepatide (ZEPBOUND) 5 MG/0.5ML Pen Inject 5 mg into the skin once a week. Start after completing 2.5 mg for four weeks.   No facility-administered medications prior to visit.    Review of Systems  All other systems reviewed and are negative.  Except see HPI       Objective    BP 115/73   Pulse 96   Resp 16   Ht 6' (1.829 m)   Wt 225 lb (102.1 kg)   SpO2  100%   BMI 30.52 kg/m     Physical Exam Constitutional:      General: He is not in acute distress.    Appearance: Normal appearance. He is not diaphoretic.  HENT:     Head: Normocephalic.  Eyes:     Conjunctiva/sclera: Conjunctivae normal.  Pulmonary:     Effort: Pulmonary effort is normal. No respiratory distress.  Musculoskeletal:        General: Tenderness (right arm from right neck to right elbow) present. No swelling, deformity or signs of injury.  Skin:    Coloration: Skin is not jaundiced or pale.     Findings: No bruising, erythema, lesion or rash.  Neurological:     Mental Status: He is alert and oriented to person, place, and time. Mental status is at baseline.     Cranial Nerves: No cranial nerve deficit.     Motor: No weakness.     Coordination: Coordination normal.      No results found for any visits on 10/18/22.  Assessment & Plan        Neck and Shoulder Pain Acute onset  of neck and shoulder pain with limited range of motion and radiating discomfort to the elbow. No history of trauma. Pain exacerbated by certain movements and positions. EKG and chest scan were normal at urgent care, suggesting a musculoskeletal origin. -Start Gabapentin 100mg , one tablet three times daily, for nerve pain. -Continue over-the-counter Tylenol and Ibuprofen for additional pain relief. -Consider use of topical analgesics such as Biofreeze and lidocaine patches. -Refer to physical therapy for rehabilitation and to improve mobility. -Return visit in one week if symptoms persist or worsen.    - cyclobenzaprine (FLEXERIL) 5 MG tablet; Take 1 tablet (5 mg total) by mouth 3 (three) times daily as needed for muscle spasms.  Dispense: 30 tablet; Refill: 1 Take at bedtime - gabapentin (NEURONTIN) 100 MG capsule; Take 1 capsule (100 mg total) by mouth 3 (three) times daily.  Dispense: 90 capsule; Refill: 3  Work note was provided.  No follow-ups on file.     The patient was advised to call back or seek an in-person evaluation if the symptoms worsen or if the condition fails to improve as anticipated.  I discussed the assessment and treatment plan with the patient. The patient was provided an opportunity to ask questions and all were answered. The patient agreed with the plan and demonstrated an understanding of the instructions.  I, Debera Lat, PA-C have reviewed all documentation for this visit. The documentation on 10/18/22  for the exam, diagnosis, procedures, and orders are all accurate and complete.  Debera Lat, [facility], MMS [facility] [phone] (phone) [phone] (fax)  [facility]

## 2022-10-19 ENCOUNTER — Encounter: Payer: Self-pay | Admitting: Physician Assistant

## 2022-10-19 DIAGNOSIS — M79601 Pain in right arm: Secondary | ICD-10-CM

## 2022-11-03 ENCOUNTER — Ambulatory Visit: Payer: 59 | Admitting: Family Medicine

## 2022-11-03 NOTE — Progress Notes (Deleted)
      Established patient visit   Patient: Jason Stanton   DOB: 01/10/1986   37 y.o. Male  MRN: [id number] Visit Date: 11/03/2022  Today's healthcare provider: Ronnald Ramp, MD   No chief complaint on file.  Subjective       Discussed the use of AI scribe software for clinical note transcription with the patient, who gave verbal consent to proceed.  History of Present Illness             Past Medical History:  Diagnosis Date   Heart murmur     Medications: Outpatient Medications Prior to Visit  Medication Sig   amlodipine-benazepril (LOTREL) 2.5-10 MG capsule Take 1 capsule by mouth daily.   cyclobenzaprine (FLEXERIL) 5 MG tablet Take 1 tablet (5 mg total) by mouth 3 (three) times daily as needed for muscle spasms.   diclofenac (VOLTAREN) 75 MG EC tablet Take 1 tablet (75 mg total) by mouth 2 (two) times daily.   gabapentin (NEURONTIN) 100 MG capsule Take 1 capsule (100 mg total) by mouth 3 (three) times daily.   LORazepam (ATIVAN) 0.5 MG tablet Take 1 tablet (0.5 mg total) by mouth every 8 (eight) hours as needed (for severe anxiety).   tirzepatide (ZEPBOUND) 2.5 MG/0.5ML Pen Inject 2.5 mg into the skin once a week. For four weeks.   tirzepatide (ZEPBOUND) 5 MG/0.5ML Pen Inject 5 mg into the skin once a week. Start after completing 2.5 mg for four weeks.   No facility-administered medications prior to visit.    Review of Systems  Last metabolic panel Lab Results  Component Value Date   GLUCOSE 87 07/14/2022   NA 140 07/14/2022   K 4.5 07/14/2022   CL 104 07/14/2022   CO2 21 07/14/2022   BUN 14 07/14/2022   CREATININE 1.10 07/14/2022   EGFR 90 07/14/2022   CALCIUM 9.6 07/14/2022   PROT 7.2 07/14/2022   ALBUMIN 4.7 07/14/2022   LABGLOB 2.5 07/14/2022   AGRATIO 1.8 12/03/2020   BILITOT 0.3 07/14/2022   ALKPHOS 69 07/14/2022   AST 75 (H) 07/14/2022   ALT 135 (H) 07/14/2022   ANIONGAP 11 08/16/2016   Last lipids Lab Results   Component Value Date   CHOL 160 10/11/2018   HDL 59 10/11/2018   LDLCALC 82 10/11/2018   TRIG 107 10/11/2018   CHOLHDL 2.7 10/11/2018   Last hemoglobin A1c Lab Results  Component Value Date   HGBA1C 5.2 07/14/2022     {See past labs  Heme  Chem  Endocrine  Serology  Results Review (optional):1}   Objective    There were no vitals taken for this visit. BP Readings from Last 3 Encounters:  10/18/22 115/73  10/17/22 (!) 136/98  07/14/22 135/89   Wt Readings from Last 3 Encounters:  10/18/22 225 lb (102.1 kg)  10/17/22 228 lb (103.4 kg)  07/14/22 250 lb (113.4 kg)    {See vitals history (optional):1}   Physical Exam  ***  No results found for any visits on 11/03/22.  Assessment & Plan     Problem List Items Addressed This Visit   None   Assessment and Plan              No follow-ups on file.         Ronnald Ramp, MD  [facility] [phone] (phone) [phone] (fax)  [facility]

## 2022-12-19 ENCOUNTER — Other Ambulatory Visit: Payer: Self-pay | Admitting: Family Medicine

## 2022-12-19 DIAGNOSIS — F410 Panic disorder [episodic paroxysmal anxiety] without agoraphobia: Secondary | ICD-10-CM

## 2022-12-19 NOTE — Telephone Encounter (Signed)
Medication Refill -  Most Recent Primary Care Visit:  Provider: Debera Lat  Department: [facility]  Visit Type: OFFICE VISIT  Date: 10/18/2022  Medication: LORazepam (ATIVAN) 0.5 MG tablet   Has the patient contacted their pharmacy? No (Agent: If no, request that the patient contact the pharmacy for the refill. If patient does not wish to contact the pharmacy document the reason why and proceed with request.)   Is this the correct pharmacy for this prescription? Yes If no, delete pharmacy and type the correct one.  This is the patient's preferred pharmacy:   [facility] [address], [place] - [address] [place] [address] Phone: [phone] Fax: [phone]   Has the prescription been filled recently? Yes  Is the patient out of the medication? Yes  Has the patient been seen for an appointment in the last year OR does the patient have an upcoming appointment? Yes  Can we respond through MyChart? Yes  Agent: Please be advised that Rx refills may take up to 3 business days. We ask that you follow-up with your pharmacy.

## 2022-12-21 MED ORDER — LORAZEPAM 0.5 MG PO TABS
0.5000 mg | ORAL_TABLET | Freq: Three times a day (TID) | ORAL | 1 refills | Status: DC | PRN
Start: 2022-12-21 — End: 2023-05-02

## 2022-12-21 NOTE — Telephone Encounter (Signed)
Requested medication (s) are due for refill today:   Provider to review  Requested medication (s) are on the active medication list:   Yes  Future visit scheduled:   Yes  12/19 with Dr. Roxan Hockey    LOV 10/18/2022   Last ordered: By Arvella Nigh, PA-C  Non delegated refill    Requested Prescriptions  Pending Prescriptions Disp Refills   LORazepam (ATIVAN) 0.5 MG tablet 30 tablet 1    Sig: Take 1 tablet (0.5 mg total) by mouth every 8 (eight) hours as needed (for severe anxiety).     Not Delegated - Psychiatry: Anxiolytics/Hypnotics 2 Failed - 12/19/2022  3:06 PM      Failed - This refill cannot be delegated      Failed - Urine Drug Screen completed in last 360 days      Passed - Patient is not pregnant      Passed - Valid encounter within last 6 months    Recent Outpatient Visits           2 months ago Pain of right upper extremity   [place] [facility] [place], PA-C   5 months ago Essential hypertension   [place] [facility] [name], PA-C   9 months ago COVID-19   [facility] [name], PA-C   1 year ago Essential hypertension   [place] [facility] [name], PA-C   1 year ago Upper respiratory tract infection, unspecified type   [facility] [name], PA-C       Future Appointments             In 2 weeks Simmons-Robinson, Tawanna Cooler, MD [facility], [facility]

## 2022-12-25 ENCOUNTER — Telehealth: Payer: Self-pay | Admitting: Family Medicine

## 2022-12-25 NOTE — Telephone Encounter (Signed)
Received a fax from covermymeds for zepbound 2.5mg /0.65ml  Key:  [id number]

## 2023-01-03 NOTE — Telephone Encounter (Signed)
Archived by Conrad Butte 12/25/2022 "Archived - Outcome: Unsent  Archive Reason: Patient no longer seen by this provider"

## 2023-01-04 ENCOUNTER — Ambulatory Visit: Payer: 59 | Admitting: Family Medicine

## 2023-01-04 ENCOUNTER — Encounter: Payer: Self-pay | Admitting: Family Medicine

## 2023-01-04 VITALS — BP 122/85 | HR 94 | Ht 72.0 in | Wt 226.0 lb

## 2023-01-04 DIAGNOSIS — F410 Panic disorder [episodic paroxysmal anxiety] without agoraphobia: Secondary | ICD-10-CM | POA: Diagnosis not present

## 2023-01-04 DIAGNOSIS — Z6833 Body mass index (BMI) 33.0-33.9, adult: Secondary | ICD-10-CM

## 2023-01-04 DIAGNOSIS — I10 Essential (primary) hypertension: Secondary | ICD-10-CM | POA: Diagnosis not present

## 2023-01-04 DIAGNOSIS — E669 Obesity, unspecified: Secondary | ICD-10-CM

## 2023-01-04 MED ORDER — ZEPBOUND 2.5 MG/0.5ML ~~LOC~~ SOAJ
2.5000 mg | SUBCUTANEOUS | 0 refills | Status: DC
Start: 2023-01-04 — End: 2023-02-05

## 2023-01-04 NOTE — Assessment & Plan Note (Addendum)
Chronic  On amlodipine 2.5 mg and benazepril 10 mg daily. Home BP monitoring weekly. Systolic BP optimal, diastolic BP slightly elevated. Discussed continued monitoring and current medication efficacy. - Continue current medications

## 2023-01-04 NOTE — Assessment & Plan Note (Addendum)
Chronic  BMI 30.65. On Zepbound 5 mg for weight management. Initial significant weight loss but severe side effects (heartburn, cramping) led to self-adjustment to every other week. Stable at 220-226 pounds. Discussed risks of heartburn and cramping with higher dose, benefits of weight loss, and alternative dosing strategies. Prefers every other week dosing but agreed to try 2.5 mg weekly. - Switch to Zepbound 2.5 mg once weekly - Monitor weight and side effects

## 2023-01-04 NOTE — Patient Instructions (Signed)
VISIT SUMMARY:  Today, we discussed your ongoing weight management, hypertension, panic disorder, and history of pinched nerves. We reviewed your current medications and made some adjustments to better manage your conditions and improve your overall health.  YOUR PLAN:  -OBESITY: Obesity is a condition characterized by excessive body fat. We discussed the side effects you experienced with Zypram and decided to switch to Zepbound 2.5 mg once weekly to help manage your weight while minimizing side effects. Please monitor your weight and any side effects you may experience.  -HYPERTENSION: Hypertension, or high blood pressure, is a condition where the force of the blood against your artery walls is too high. Your blood pressure is being managed with amlodipine and benazepril. Continue taking these medications and monitor your blood pressure regularly.  -PANIC DISORDER: Panic disorder is a type of anxiety disorder characterized by sudden and repeated episodes of intense fear. You are currently managing this with Ativan as needed. Continue to use Ativan when necessary and keep track of the frequency and severity of your panic attacks.  -CHRONIC PAIN (PINCHED NERVES): Pinched nerves occur when too much pressure is applied to a nerve by surrounding tissues. Your symptoms have improved with weight loss, and we have discontinued gabapentin. Please be aware of any recurrence of numbness and report it if it happens.  -GENERAL HEALTH MAINTENANCE: It's important to keep up with regular health check-ups and lab tests. You had a tetanus vaccine three years ago and labs done in June. We recommend scheduling a physical exam in March or April, during which we will also perform updated blood work.  INSTRUCTIONS:  Please follow up in March or April for your physical exam and blood work. Continue monitoring your weight, blood pressure, and the frequency and severity of your panic attacks. Report any new or worsening  symptoms.

## 2023-01-04 NOTE — Progress Notes (Signed)
Established patient visit   Patient: Jason Stanton   DOB: 02/05/1985   37 y.o. Male  MRN: [id number] Visit Date: 01/04/2023  Today's healthcare provider: Ronnald Ramp, MD   Chief Complaint  Patient presents with   Obesity    Follow up, meet new PCP   Subjective     HPI     Obesity    Additional comments: Follow up, meet new PCP      Last edited by Ashok Cordia, CMA on 01/04/2023  8:23 AM.       Discussed the use of AI scribe software for clinical note transcription with the patient, who gave verbal consent to proceed.  History of Present Illness   The patient is a 37 year old male presenting for weight management related to obesity, with a BMI of 30.65. He is currently on Zypram for obesity, amlodipine and benazepril for hypertension, gabapentin for a previous pinched nerve, and Ativan as needed for panic disorder. His weight has remained stable, fluctuating between 219 and 225 pounds.  The patient reports that he initially lost significant weight on a 5mg  dose of Zypram, but experienced side effects including severe heartburn and lack of energy. To manage these side effects, he decided to take the medication every other week, which has helped him maintain his current weight. He also reports that the higher dose of Zypram caused him to lose his appetite and experience cramping on the side of the injection.  In addition to his weight management, the patient has a history of panic attacks, which occur sporadically, sometimes several times a week or as infrequently as once every few months. He also has a history of a pinched nerve, which caused numbness shooting down to his foot. However, since losing weight, the numbness has significantly decreased and only occurs occasionally when he is inactive for extended periods.  The patient also reports a recent incident where he experienced severe heartburn after taking Zypram. He has been managing this with  over-the-counter antacids and has adjusted his diet to avoid foods that exacerbate the heartburn.  The patient's hypertension is managed with amlodipine and benazepril, and his blood pressure is regularly monitored at home. He reports that his top number is perfect, but the bottom number is slightly higher than desired.  The patient's gabapentin, initially prescribed for a pinched nerve, has been discontinued due to intensification of numbness in his foot after taking the medication. The numbness has since decreased significantly after discontinuing the medication and losing weight.  The patient's panic disorder is managed with as-needed Ativan. The frequency of panic attacks varies, sometimes occurring multiple times a week or as infrequently as once every few months. The patient notes that being late or close to being late can trigger these attacks.       Reports having tetanus vaccine three years ago at his job   Past Medical History:  Diagnosis Date   Heart murmur     Medications: Outpatient Medications Prior to Visit  Medication Sig   amlodipine-benazepril (LOTREL) 2.5-10 MG capsule Take 1 capsule by mouth daily.   cyclobenzaprine (FLEXERIL) 5 MG tablet Take 1 tablet (5 mg total) by mouth 3 (three) times daily as needed for muscle spasms.   diclofenac (VOLTAREN) 75 MG EC tablet Take 1 tablet (75 mg total) by mouth 2 (two) times daily.   LORazepam (ATIVAN) 0.5 MG tablet Take 1 tablet (0.5 mg total) by mouth every 8 (eight) hours as needed (for severe anxiety).   [  DISCONTINUED] gabapentin (NEURONTIN) 100 MG capsule Take 1 capsule (100 mg total) by mouth 3 (three) times daily.   [DISCONTINUED] tirzepatide (ZEPBOUND) 2.5 MG/0.5ML Pen Inject 2.5 mg into the skin once a week. For four weeks.   [DISCONTINUED] tirzepatide (ZEPBOUND) 5 MG/0.5ML Pen Inject 5 mg into the skin once a week. Start after completing 2.5 mg for four weeks. (Patient taking differently: Inject 5 mg into the skin once a  week. Start after completing 2.5 mg for four weeks.  Taking every 14 days)   No facility-administered medications prior to visit.    Review of Systems  Last CBC Lab Results  Component Value Date   WBC 9.4 07/14/2022   HGB 15.5 07/14/2022   HCT 44.4 07/14/2022   MCV 93 07/14/2022   MCH 32.5 07/14/2022   RDW 12.6 07/14/2022   PLT 221 07/14/2022   Last metabolic panel Lab Results  Component Value Date   GLUCOSE 87 07/14/2022   NA 140 07/14/2022   K 4.5 07/14/2022   CL 104 07/14/2022   CO2 21 07/14/2022   BUN 14 07/14/2022   CREATININE 1.10 07/14/2022   EGFR 90 07/14/2022   CALCIUM 9.6 07/14/2022   PROT 7.2 07/14/2022   ALBUMIN 4.7 07/14/2022   LABGLOB 2.5 07/14/2022   AGRATIO 1.8 12/03/2020   BILITOT 0.3 07/14/2022   ALKPHOS 69 07/14/2022   AST 75 (H) 07/14/2022   ALT 135 (H) 07/14/2022   ANIONGAP 11 08/16/2016   Last lipids Lab Results  Component Value Date   CHOL 160 10/11/2018   HDL 59 10/11/2018   LDLCALC 82 10/11/2018   TRIG 107 10/11/2018   CHOLHDL 2.7 10/11/2018   Last hemoglobin A1c Lab Results  Component Value Date   HGBA1C 5.2 07/14/2022   Last thyroid functions Lab Results  Component Value Date   TSH 2.070 10/11/2018   Last vitamin D No results found for: "25OHVITD2", "25OHVITD3", "VD25OH"      Objective    BP 122/85   Pulse 94   Ht 6' (1.829 m)   Wt 226 lb (102.5 kg)   SpO2 100%   BMI 30.65 kg/m   BP Readings from Last 3 Encounters:  01/04/23 122/85  10/18/22 115/73  10/17/22 (!) 136/98   Wt Readings from Last 3 Encounters:  01/04/23 226 lb (102.5 kg)  10/18/22 225 lb (102.1 kg)  10/17/22 228 lb (103.4 kg)        Physical Exam  General: Alert, no acute distress Cardio: Normal S1 and S2, RRR, no r/m/g Pulm: CTAB, normal work of breathing Abdomen: Bowel sounds normal. Abdomen soft and non-tender.  Extremities: No peripheral edema.    No results found for any visits on 01/04/23.  Assessment & Plan     Problem  List Items Addressed This Visit       Cardiovascular and Mediastinum   Essential hypertension   Chronic  On amlodipine 2.5 mg and benazepril 10 mg daily. Home BP monitoring weekly. Systolic BP optimal, diastolic BP slightly elevated. Discussed continued monitoring and current medication efficacy. - Continue current medications        Other   Panic disorder   Chronic,intermittent  Well controlled with PRN Ativan  On Ativan 0.25 mg as needed. Panic attacks sporadic, recent severe episode around Thanksgiving. Discussed frequency and management strategies. - Continue Ativan as needed - Monitor frequency and severity of panic attacks      BMI 33.0-33.9,adult - Primary   Chronic  BMI 30.65. On Zepbound 5 mg for  weight management. Initial significant weight loss but severe side effects (heartburn, cramping) led to self-adjustment to every other week. Stable at 220-226 pounds. Discussed risks of heartburn and cramping with higher dose, benefits of weight loss, and alternative dosing strategies. Prefers every other week dosing but agreed to try 2.5 mg weekly. - Switch to Zepbound 2.5 mg once weekly - Monitor weight and side effects      Relevant Medications   tirzepatide (ZEPBOUND) 2.5 MG/0.5ML Pen        Chronic Pain (Pinched Nerves) Previously on gabapentin 100 mg TID for pinched nerves causing numbness. Symptoms improved with weight loss. Discontinued gabapentin. Discussed risks of numbness recurrence and benefits of discontinuation. - Discontinue gabapentin due  to worsening symptoms   General Health Maintenance Tetanus vaccine three years ago after nail injury. No recent physical, labs done in June. Discussed importance of regular physical exams and updated labs. - Schedule physical in March or April - Perform blood work during physical    Return in about 3 months (around 04/04/2023) for CPE.       Ronnald Ramp, MD  [facility] [phone] (phone) [phone] (fax)  [facility]

## 2023-01-04 NOTE — Assessment & Plan Note (Addendum)
Chronic,intermittent  Well controlled with PRN Ativan  On Ativan 0.25 mg as needed. Panic attacks sporadic, recent severe episode around Thanksgiving. Discussed frequency and management strategies. - Continue Ativan as needed - Monitor frequency and severity of panic attacks

## 2023-01-31 ENCOUNTER — Telehealth: Payer: Self-pay | Admitting: Family Medicine

## 2023-01-31 NOTE — Telephone Encounter (Signed)
 Covermymeds is requesting prior authorization Key: [id number] Zepbound  2.5mg /0.41ml auto injectors

## 2023-02-02 ENCOUNTER — Telehealth: Payer: Self-pay | Admitting: Family Medicine

## 2023-02-02 DIAGNOSIS — Z6833 Body mass index (BMI) 33.0-33.9, adult: Secondary | ICD-10-CM

## 2023-02-02 NOTE — Telephone Encounter (Signed)
Pt called in , checking status of auth on Zepbound 2.5. I let him know this is being worked on

## 2023-02-02 NOTE — Telephone Encounter (Signed)
[facility] faxed refill request for the following medications:   tirzepatide (ZEPBOUND) 2.5 MG/0.5ML Pen    Please advise.

## 2023-02-05 ENCOUNTER — Encounter: Payer: Self-pay | Admitting: Family Medicine

## 2023-02-05 MED ORDER — ZEPBOUND 2.5 MG/0.5ML ~~LOC~~ SOAJ
2.5000 mg | SUBCUTANEOUS | 0 refills | Status: DC
Start: 2023-02-05 — End: 2023-05-02

## 2023-02-05 NOTE — Telephone Encounter (Signed)
Prescription for Zepbound 2.5mg  weekly sent to pharmacy updated during last visit 01/04/23 due to side effects. Please proceed with PA if needed

## 2023-02-05 NOTE — Telephone Encounter (Signed)
Please advise 

## 2023-02-07 NOTE — Telephone Encounter (Signed)
Start PA (Key: [id number]) - [id number] Zepbound 2.5MG /0.5ML pen-injectors-waiting for approval

## 2023-02-08 NOTE — Telephone Encounter (Signed)
Patient checking on the status of PA. Patient stated he should have started medication on 02/06/2023. Informed patient of the PA process and advised him of the below.

## 2023-02-09 NOTE — Telephone Encounter (Signed)
ZEPBOUND INJ 2.5MG , use as directed, is approved through 02/07/2024 or until coverage for the medication is no longer available under your benefit plan.  Spoke the pt regarding approval.

## 2023-02-14 NOTE — Telephone Encounter (Signed)
Office will need to call prior auth number, listed above.   There is already a prior auth on file for zepbound however the 2.5 mg dosage only allows 4 ml in 365 days., In order to stay on this dosage another prior authorization will need to be filed.

## 2023-02-15 NOTE — Telephone Encounter (Signed)
Start another PA-(Key: [id number]) - [id number] Zepbound 2.5MG /0.5ML pen-injectors-waiting for approval.

## 2023-02-19 ENCOUNTER — Other Ambulatory Visit (HOSPITAL_COMMUNITY): Payer: Self-pay

## 2023-02-19 NOTE — Telephone Encounter (Signed)
The insurance will not pay for the 2.5 mg dose again. Is there a reason for him going back down after being on the 5 mg for quite a few months?

## 2023-02-23 ENCOUNTER — Telehealth: Payer: Self-pay | Admitting: Family Medicine

## 2023-02-23 NOTE — Telephone Encounter (Signed)
 Covermymeds is requesting prior authorization Key: [id number] Zepbound  2.5 mg/0.5ML auto injectors

## 2023-02-26 ENCOUNTER — Ambulatory Visit
Admission: EM | Admit: 2023-02-26 | Discharge: 2023-02-26 | Disposition: A | Discharge status: Home / Self Care | Payer: 59 | Attending: Emergency Medicine | Admitting: Emergency Medicine

## 2023-02-26 ENCOUNTER — Ambulatory Visit: Payer: Self-pay

## 2023-02-26 DIAGNOSIS — J069 Acute upper respiratory infection, unspecified: Secondary | ICD-10-CM | POA: Diagnosis not present

## 2023-02-26 DIAGNOSIS — M79601 Pain in right arm: Secondary | ICD-10-CM

## 2023-02-26 LAB — POC COVID19/FLU A&B COMBO
Covid Antigen, POC: NEGATIVE
Influenza A Antigen, POC: NEGATIVE
Influenza B Antigen, POC: NEGATIVE

## 2023-02-26 MED ORDER — CYCLOBENZAPRINE HCL 5 MG PO TABS
5.0000 mg | ORAL_TABLET | Freq: Three times a day (TID) | ORAL | 0 refills | Status: AC | PRN
Start: 2023-02-26 — End: ?

## 2023-02-26 NOTE — ED Triage Notes (Addendum)
 Sx x 1 day  Fever Bodyaches Chills Headache Non productive cough

## 2023-02-26 NOTE — ED Provider Notes (Signed)
 Jason Stanton    CSN: [id number] Arrival date & time: 02/26/23  7829      History   Chief Complaint Chief Complaint  Patient presents with   Fever   Cough   Headache   Generalized Body Aches    HPI Jason Stanton is a 38 y.o. male.   Patient presents for evaluation of fever peaking at 103 25, chills, body aches, nasal congestion, nonproductive cough, sore throat and diarrhea beginning 1 day ago.  No known sick contacts prior.  Poor appetite but tolerating some food and liquids.  Has attempted use of DayQuil, NyQuil.  Denies shortness of breath or wheezing.  Past Medical History:  Diagnosis Date   Heart murmur     Patient Active Problem List   Diagnosis Date Noted   BMI 33.0-33.9,adult 07/14/2022   Panic disorder 12/03/2020   Essential hypertension 02/08/2018    Past Surgical History:  Procedure Laterality Date   VASECTOMY         Home Medications    Prior to Admission medications   Medication Sig Start Date End Date Taking? Authorizing Provider  amlodipine -benazepril  (LOTREL) 2.5-10 MG capsule Take 1 capsule by mouth daily. 07/14/22  Yes Drubel, Heidi Llamas, PA-C  diclofenac  (VOLTAREN ) 75 MG EC tablet Take 1 tablet (75 mg total) by mouth 2 (two) times daily. 10/17/22  Yes Afton Albright, MD  cyclobenzaprine  (FLEXERIL ) 5 MG tablet Take 1 tablet (5 mg total) by mouth 3 (three) times daily as needed for muscle spasms. 02/26/23   Ethelbert Thain, Maybelle Spatz, NP  LORazepam  (ATIVAN ) 0.5 MG tablet Take 1 tablet (0.5 mg total) by mouth every 8 (eight) hours as needed (for severe anxiety). 12/21/22   Simmons-Robinson, Judyann Number, MD  tirzepatide  (ZEPBOUND ) 2.5 MG/0.5ML Pen Inject 2.5 mg into the skin once a week. 02/05/23   Simmons-Robinson, Judyann Number, MD    Family History Family History  Problem Relation Age of Onset   Cancer Mother     Social History Social History   Tobacco Use   Smoking status: Every Day    Current packs/day: 0.00    Average packs/day: 0.5 packs/day  for 2.0 years (1.0 ttl pk-yrs)    Types: Cigarettes    Start date: 07/04/2018    Last attempt to quit: 07/03/2020    Years since quitting: 2.6   Smokeless tobacco: Never   Tobacco comments:    Stopped 5 months ago but still has urges  Vaping Use   Vaping status: Former  Substance Use Topics   Alcohol use: Yes    Alcohol/week: 1.0 - 2.0 standard drink of alcohol    Types: 1 - 2 Cans of beer per week   Drug use: No     Allergies   Stadol [butorphanol]   Review of Systems Review of Systems   Physical Exam Triage Vital Signs ED Triage Vitals  Encounter Vitals Group     BP 02/26/23 0835 126/88     Systolic BP Percentile --      Diastolic BP Percentile --      Pulse Rate 02/26/23 0835 100     Resp 02/26/23 0835 18     Temp 02/26/23 0835 99.9 F (37.7 C)     Temp Source 02/26/23 0835 Oral     SpO2 02/26/23 0835 97 %     Weight --      Height --      Head Circumference --      Peak Flow --      Pain  Score 02/26/23 0834 6     Pain Loc --      Pain Education --      Exclude from Growth Chart --    No data found.  Updated Vital Signs BP 126/88 (BP Location: Right Arm)   Pulse 100   Temp 99.9 F (37.7 C) (Oral)   Resp 18   SpO2 97%   Visual Acuity Right Eye Distance:   Left Eye Distance:   Bilateral Distance:    Right Eye Near:   Left Eye Near:    Bilateral Near:     Physical Exam Constitutional:      Appearance: He is ill-appearing.  HENT:     Head: Normocephalic.     Right Ear: Tympanic membrane, ear canal and external ear normal.     Left Ear: Tympanic membrane, ear canal and external ear normal.     Nose: Congestion and rhinorrhea present.     Mouth/Throat:     Mouth: Mucous membranes are moist.     Pharynx: Oropharynx is clear. Posterior oropharyngeal erythema present. No oropharyngeal exudate.  Eyes:     Extraocular Movements: Extraocular movements intact.  Cardiovascular:     Rate and Rhythm: Normal rate and regular rhythm.     Pulses: Normal  pulses.     Heart sounds: Normal heart sounds.  Pulmonary:     Effort: Pulmonary effort is normal.     Breath sounds: Normal breath sounds.  Neurological:     Mental Status: He is alert and oriented to person, place, and time. Mental status is at baseline.      UC Treatments / Results  Labs (all labs ordered are listed, but only abnormal results are displayed) Labs Reviewed  POC COVID19/FLU A&B COMBO - Normal    EKG   Radiology No results found.  Procedures Procedures (including critical care time)  Medications Ordered in UC Medications - No data to display  Initial Impression / Assessment and Plan / UC Course  I have reviewed the triage vital signs and the nursing notes.  Pertinent labs & imaging results that were available during my care of the patient were reviewed by me and considered in my medical decision making (see chart for details).  Viral URI with cough  Patient is in no signs of distress nor toxic appearing.  Vital signs are stable.  Low suspicion for pneumonia, pneumothorax or bronchitis and therefore will defer imaging.  COVID and flu test negative.  Most worrisome symptom chills with sweats, recommended consistent use of over-the-counter analgesics, Flexeril  prescribed for body aches.May use additional over-the-counter medications as needed for supportive care.  May follow-up with urgent care as needed if symptoms persist or worsen.  Note given.   Final Clinical Impressions(s) / UC Diagnoses   Final diagnoses:  Viral URI with cough     Discharge Instructions      Your symptoms today are most likely being caused by a virus and should steadily improve in time it can take up to 7 to 10 days before you truly start to see a turnaround however things will get better  COVID and flu test negative  You may use muscle every 8 hours as needed to help with bodyaches    You can take Tylenol  and/or Ibuprofen as needed for fever reduction and pain relief.    For cough: honey 1/2 to 1 teaspoon (you can dilute the honey in water or another fluid).  You can also use guaifenesin and dextromethorphan for cough.  You can use a humidifier for chest congestion and cough.  If you don't have a humidifier, you can sit in the bathroom with the hot shower running.      For sore throat: try warm salt water gargles, cepacol lozenges, throat spray, warm tea or water with lemon/honey, popsicles or ice, or OTC cold relief medicine for throat discomfort.   For congestion: take a daily anti-histamine like Zyrtec, Claritin, and a oral decongestant, such as pseudoephedrine.  You can also use Flonase 1-2 sprays in each nostril daily.   It is important to stay hydrated: drink plenty of fluids (water, gatorade/powerade/pedialyte, juices, or teas) to keep your throat moisturized and help further relieve irritation/discomfort.    ED Prescriptions     Medication Sig Dispense Auth. Provider   cyclobenzaprine  (FLEXERIL ) 5 MG tablet Take 1 tablet (5 mg total) by mouth 3 (three) times daily as needed for muscle spasms. 20 tablet Teresea Donley R, NP      PDMP not reviewed this encounter.   Reena Canning, NP 02/26/23 [phone]

## 2023-02-26 NOTE — Discharge Instructions (Signed)
 Your symptoms today are most likely being caused by a virus and should steadily improve in time it can take up to 7 to 10 days before you truly start to see a turnaround however things will get better  COVID and flu test negative  You may use muscle every 8 hours as needed to help with bodyaches    You can take Tylenol  and/or Ibuprofen as needed for fever reduction and pain relief.   For cough: honey 1/2 to 1 teaspoon (you can dilute the honey in water or another fluid).  You can also use guaifenesin and dextromethorphan for cough. You can use a humidifier for chest congestion and cough.  If you don't have a humidifier, you can sit in the bathroom with the hot shower running.      For sore throat: try warm salt water gargles, cepacol lozenges, throat spray, warm tea or water with lemon/honey, popsicles or ice, or OTC cold relief medicine for throat discomfort.   For congestion: take a daily anti-histamine like Zyrtec, Claritin, and a oral decongestant, such as pseudoephedrine.  You can also use Flonase 1-2 sprays in each nostril daily.   It is important to stay hydrated: drink plenty of fluids (water, gatorade/powerade/pedialyte, juices, or teas) to keep your throat moisturized and help further relieve irritation/discomfort.

## 2023-02-26 NOTE — Telephone Encounter (Signed)
  Chief Complaint: fever Symptoms: dry cough, headache, moderate chills Frequency: sudden onset yesterday Pertinent Negatives: Patient denies SOB Disposition: [] ED /[] Urgent Care (no appt availability in office) / [x] Appointment(In office/virtual)/ []  [place] [facility]/ [] Home Care/ [] Refused Recommended Disposition /[] [place] [facility]/ []  Follow-up with PCP Additional Notes: VV tomorrow am- tested negative for flu and covid Reason for Disposition  Patient sounds very sick or weak to the triager  (Exception: Mild weakness and hasn't taken fever medicine.)  Answer Assessment - Initial Assessment Questions 1. TEMPERATURE: "What is the most recent temperature?"  "How was it measured?"      101.5 2. ONSET: "When did the fever start?"      Yesterday  3. CHILLS: "Do you have chills?" If yes: "How bad are they?"  (e.g., none, mild, moderate, severe)   - NONE: no chills   - MILD: feeling cold   - MODERATE: feeling very cold, some shivering (feels better under a thick blanket)   - SEVERE: feeling extremely cold with shaking chills (general body shaking, rigors; even under a thick blanket)      Chills moderate 4. OTHER SYMPTOMS: "Do you have any other symptoms besides the fever?"  (e.g., abdomen pain, cough, diarrhea, earache, headache, sore throat, urination pain)    Dry  Cough, H/A, sore throat 6. CONTACTS: "Does anyone else in the family have an infection?"     no 7. TREATMENT: "What have you done so far to treat this fever?" (e.g., medications)     Dayquil /acetaminophen  1 ES Acetaminophen   8. IMMUNOCOMPROMISE: "Do you have of the following: diabetes, HIV positive, splenectomy, cancer chemotherapy, chronic steroid treatment, transplant patient, etc."     no  Protocols used: Fever-A-AH

## 2023-02-27 ENCOUNTER — Telehealth: Payer: Self-pay

## 2023-02-27 ENCOUNTER — Telehealth (INDEPENDENT_AMBULATORY_CARE_PROVIDER_SITE_OTHER): Payer: 59 | Admitting: Family Medicine

## 2023-02-27 DIAGNOSIS — J069 Acute upper respiratory infection, unspecified: Secondary | ICD-10-CM | POA: Diagnosis not present

## 2023-02-27 DIAGNOSIS — J988 Other specified respiratory disorders: Secondary | ICD-10-CM

## 2023-02-27 MED ORDER — AZITHROMYCIN 250 MG PO TABS: ORAL_TABLET | ORAL | 0 refills | Status: AC

## 2023-02-27 MED ORDER — PROMETHAZINE-DM 6.25-15 MG/5ML PO SYRP: 5.0000 mL | ORAL_SOLUTION | Freq: Four times a day (QID) | ORAL | 0 refills | Status: DC | PRN

## 2023-02-27 NOTE — Telephone Encounter (Signed)
Pharmacy Patient Advocate Encounter   Received notification from Pt Calls Messages that prior authorization for Zepbound 2.5MG /0.5ML pen-injectors is required/requested.   Insurance verification completed.   The patient is insured through [employer]   Per test claim: PA required; PA submitted to above mentioned insurance via CoverMyMeds Key/confirmation #/EOC [id number] Status is pending

## 2023-02-27 NOTE — Progress Notes (Signed)
MyChart Video Visit    Virtual Visit via Video Note   This format is felt to be most appropriate for this patient at this time. Physical exam was limited by quality of the video and audio technology used for the visit.   Patient location: HOME Provider location: [facility]  I discussed the limitations of evaluation and management by telemedicine and the availability of in person appointments. The patient expressed understanding and agreed to proceed.  Patient: Jason Stanton   DOB: 10/10/1985   38 y.o. Male  MRN: [id number] Visit Date: 02/27/2023  Today's healthcare provider: Mila Merry, MD   No chief complaint on file.  Subjective    Discussed the use of AI scribe software for clinical note transcription with the patient, who gave verbal consent to proceed.  History of Present Illness   Jason Stanton is a 38 year old male who presents with fever, cough, and body aches. He is accompanied by his wife. He was referred by the emergency department for further evaluation.  Symptoms began on Sunday around 1 PM with a fever of 101.51F, body aches, severe headaches, and hot and cold chills. By that night, he developed a persistent cough lasting for almost three days, causing significant discomfort and pain, particularly in his ribs. No significant mucus production has been noted.  He feels extremely fatigued, with pain from his hips to his shoulders, and has been mostly bedridden due to lack of energy. Severe headaches persist, unrelieved by over-the-counter medications such as ibuprofen, Tylenol, Nyquil, or DayQuil. He has also been using throat lozenges and an ice pack for his headaches, particularly at night to aid sleep.  He visited an urgent care where he was tested for flu and COVID-19, both of which returned negative results. Despite having a fever of 101.84F at the time of the visit, he did not receive any specific treatment or advice, prompting his wife to contact his  family doctor.  No shortness of breath and he has not been around anyone known to be sick. He is not aware of any allergies to antibiotics, but notes an allergy to Steviol, a pain medication.       Medications: Outpatient Medications Prior to Visit  Medication Sig   amlodipine-benazepril (LOTREL) 2.5-10 MG capsule Take 1 capsule by mouth daily.   cyclobenzaprine (FLEXERIL) 5 MG tablet Take 1 tablet (5 mg total) by mouth 3 (three) times daily as needed for muscle spasms.   diclofenac (VOLTAREN) 75 MG EC tablet Take 1 tablet (75 mg total) by mouth 2 (two) times daily.   LORazepam (ATIVAN) 0.5 MG tablet Take 1 tablet (0.5 mg total) by mouth every 8 (eight) hours as needed (for severe anxiety).   tirzepatide (ZEPBOUND) 2.5 MG/0.5ML Pen Inject 2.5 mg into the skin once a week.   No facility-administered medications prior to visit.      Objective    There were no vitals taken for this visit.    Physical Exam  Awake, alert, oriented x 3. In no apparent distress Occasionally coughing during interview     Assessment & Plan       Acute Respiratory Illness Fever, body aches, headache, and persistent cough for 3 days. Negative for flu and COVID-19. Possible viral or bacterial infection such as RSV or Mycoplasma. -Start Zithromax (antibiotic). -Promethazine DM -Stay out of work for another day until fever-free for at least 24 hours.        I discussed the assessment and treatment plan  with the patient. The patient was provided an opportunity to ask questions and all were answered. The patient agreed with the plan and demonstrated an understanding of the instructions.   The patient was advised to call back or seek an in-person evaluation if the symptoms worsen or if the condition fails to improve as anticipated.  I provided 9 minutes of non-face-to-face time during this encounter.   Mila Merry, MD [facility] [phone] (phone) [phone]  (fax)  [facility]

## 2023-02-27 NOTE — Patient Instructions (Signed)
Marland Kitchen  Please review the attached list of medications and notify my office if there are any errors.   . Please bring all of your medications to every appointment so we can make sure that our medication list is the same as yours.

## 2023-02-28 ENCOUNTER — Encounter: Payer: Self-pay | Admitting: Family Medicine

## 2023-02-28 ENCOUNTER — Telehealth: Payer: Self-pay | Admitting: Family Medicine

## 2023-02-28 NOTE — Telephone Encounter (Signed)
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 Patient is still not feeling well. Patient would like to know when it's completed, so wife can pick it up.

## 2023-02-28 NOTE — Telephone Encounter (Signed)
That's fine, please send updated work excuse

## 2023-03-05 ENCOUNTER — Other Ambulatory Visit (HOSPITAL_COMMUNITY): Payer: Self-pay

## 2023-03-05 NOTE — Telephone Encounter (Signed)
 Pharmacy Patient Advocate Encounter  Received notification from [facility] that Prior Authorization for Zepbound 2.5MG /0.5ML pen-injectors has been APPROVED from 02/27/23 to 04/27/23. Ran test claim, Copay is $24.99. This test claim was processed through [facility]- copay amounts may vary at other pharmacies due to pharmacy/plan contracts, or as the patient moves through the different stages of their insurance plan.   PA #/Case ID/Reference #: [id number]   *spoke with [facility] to process

## 2023-03-30 ENCOUNTER — Other Ambulatory Visit (HOSPITAL_COMMUNITY): Payer: Self-pay

## 2023-04-04 ENCOUNTER — Other Ambulatory Visit (HOSPITAL_COMMUNITY): Payer: Self-pay

## 2023-04-06 ENCOUNTER — Encounter: Payer: Self-pay | Admitting: Family Medicine

## 2023-05-02 ENCOUNTER — Other Ambulatory Visit: Payer: Self-pay | Admitting: Family Medicine

## 2023-05-02 DIAGNOSIS — Z6833 Body mass index (BMI) 33.0-33.9, adult: Secondary | ICD-10-CM

## 2023-05-02 DIAGNOSIS — F410 Panic disorder [episodic paroxysmal anxiety] without agoraphobia: Secondary | ICD-10-CM

## 2023-05-02 NOTE — Telephone Encounter (Signed)
 Copied from CRM [id number]. Topic: Clinical - Medication Refill >> May 02, 2023 11:41 AM Arminda Landmark wrote: Most Recent Primary Care Visit:  Provider: Lamon Pillow  Department: [facility]  Visit Type: MYCHART VIDEO VISIT  Date: 02/27/2023  Medication: tirzepatide (ZEPBOUND) 2.5 MG/0.5ML Pen And LORazepam (ATIVAN) 0.5 MG tablet  Has the patient contacted their pharmacy? Yes (Agent: If no, request that the patient contact the pharmacy for the refill. If patient does not wish to contact the pharmacy document the reason why and proceed with request.) (Agent: If yes, when and what did the pharmacy advise?)  Is this the correct pharmacy for this prescription? Yes If no, delete pharmacy and type the correct one.  This is the patient's preferred pharmacy:  [facility] [address], [place] - [address] [place] [address] Phone: [phone] Fax: [phone]   Has the prescription been filled recently? Yes  Is the patient out of the medication? Yes  Has the patient been seen for an appointment in the last year OR does the patient have an upcoming appointment? Yes  Can we respond through MyChart? Yes  Agent: Please be advised that Rx refills may take up to 3 business days. We ask that you follow-up with your pharmacy.

## 2023-05-03 MED ORDER — ZEPBOUND 2.5 MG/0.5ML ~~LOC~~ SOAJ
2.5000 mg | SUBCUTANEOUS | 0 refills | Status: DC
Start: 2023-05-03 — End: 2023-05-15

## 2023-05-03 MED ORDER — LORAZEPAM 0.5 MG PO TABS: 0.5000 mg | ORAL_TABLET | Freq: Three times a day (TID) | ORAL | 0 refills | Status: DC | PRN

## 2023-05-03 NOTE — Telephone Encounter (Signed)
 Requested medication (s) are due for refill today:   Provider to review both    One is non delegated and the other does not have a protocol assigned  Requested medication (s) are on the active medication list:   Yes for both  Future visit scheduled:   No.   02/27/2023 Video visit for URI;    01/04/2023 OV by Dr. Verdia Glad   Last ordered: Zepbound 02/05/2023 6 ml, 0 refill;   Ativan 12/21/2022 #30, 1 refill  Returned because Zepbound does not have a protocol assigned and the Ativan is a non delegated refill.      Requested Prescriptions  Pending Prescriptions Disp Refills   LORazepam (ATIVAN) 0.5 MG tablet 30 tablet 1    Sig: Take 1 tablet (0.5 mg total) by mouth every 8 (eight) hours as needed (for severe anxiety).     Not Delegated - Psychiatry: Anxiolytics/Hypnotics 2 Failed - 05/03/2023 12:14 PM      Failed - This refill cannot be delegated      Failed - Urine Drug Screen completed in last 360 days      Passed - Patient is not pregnant      Passed - Valid encounter within last 6 months    Recent Outpatient Visits           2 months ago Respiratory infection   [facility] Lamon Pillow, MD               tirzepatide (ZEPBOUND) 2.5 MG/0.5ML Pen 6 mL 0    Sig: Inject 2.5 mg into the skin once a week.     Off-Protocol Failed - 05/03/2023 12:14 PM      Failed - Medication not assigned to a protocol, review manually.      Passed - Valid encounter within last 12 months    Recent Outpatient Visits           2 months ago Respiratory infection   [facility] Lamon Pillow, MD

## 2023-05-04 ENCOUNTER — Other Ambulatory Visit (HOSPITAL_COMMUNITY): Payer: Self-pay

## 2023-05-04 ENCOUNTER — Telehealth: Payer: Self-pay

## 2023-05-04 DIAGNOSIS — Z6833 Body mass index (BMI) 33.0-33.9, adult: Secondary | ICD-10-CM

## 2023-05-04 NOTE — Telephone Encounter (Signed)
 Pharmacy Patient Advocate Encounter   Received notification from [employer] that prior authorization for Zepbound  2.5MG /0.5ML pen-injectors is required/requested.   Insurance verification completed.   The patient is insured through [facility] .   Per test claim: PA required; PA submitted to above mentioned insurance via CoverMyMeds Key/confirmation #/EOC [facility] Status is pending

## 2023-05-07 ENCOUNTER — Other Ambulatory Visit (HOSPITAL_COMMUNITY): Payer: Self-pay

## 2023-05-07 NOTE — Telephone Encounter (Signed)
 Pharmacy Patient Advocate Encounter  Received notification from [employer] that Prior Authorization for Zepbound  2.5MG /0.5ML pen-injectors has been APPROVED from 05/04/23 to 05/03/24   PA #/Case ID/Reference #: [id number]

## 2023-05-14 NOTE — Telephone Encounter (Unsigned)
 Copied from CRM [id number]. Topic: Clinical - Prescription Issue >> May 14, 2023 10:29 AM Phil Braun wrote: Reason for CRM: Pt called and is needing his medication, could you please send this prior auth over to the pharmacy for refill?  Ref:tirzepatide  (ZEPBOUND ) 2.5 MG/0.5ML Pen   Pharmacy Patient Advocate Encounter   Received notification from [facility] that Prior Authorization for Zepbound  2.5MG /0.5ML pen-injectors has been APPROVED from 05/04/23 to 05/03/24    PA #/Case ID/Reference #: [id number]

## 2023-05-15 MED ORDER — ZEPBOUND 2.5 MG/0.5ML ~~LOC~~ SOAJ
2.5000 mg | SUBCUTANEOUS | 0 refills | Status: DC
Start: 2023-05-15 — End: 2023-05-22

## 2023-05-15 NOTE — Addendum Note (Signed)
 Addended by: SIMMONS-ROBINSON, Atarah Cadogan L on: 05/15/2023 07:06 AM   Modules accepted: Orders

## 2023-05-15 NOTE — Telephone Encounter (Signed)
 Prescription sent to pharmacy.

## 2023-05-17 ENCOUNTER — Telehealth: Payer: Self-pay | Admitting: Family Medicine

## 2023-05-17 NOTE — Telephone Encounter (Signed)
 I spoke to the pharmacy and they told me that they are unable to "run it" meaning that his insurance even though the prior auth has been approved they're not covering the medication. The pharmacist suggests that the clinic contact the provider line for the pt's insurance and find out what needs to be changed with the Rx for it to be covered. She mentioned titration and possibly needing to adjust the dose/mL.   Please advise and call patient back with an update today. He will be leaving town this weekend and would like his supply prior to leaving.   Best contact: [phone]

## 2023-05-17 NOTE — Telephone Encounter (Signed)
 Dominique from the [employer] group called regarding this patient letting us  know that [facility] pharmacist called stating Dr. Verdia Glad needed to contact the patients insurance company regarding the directions on the Zepbound  Rx.    A prior approval has been done and approved but insurance will not cover the Zepbound  as it is written.    Please call patient back today to let him know if this issue has been resolved.  He is very aggravated about this issue and has been out of this medication X 3 weeks.

## 2023-05-18 NOTE — Telephone Encounter (Signed)
 Please see the message below.

## 2023-05-18 NOTE — Telephone Encounter (Signed)
 The message has been forwarded to the PA team to be reviewed

## 2023-05-21 ENCOUNTER — Other Ambulatory Visit (HOSPITAL_COMMUNITY): Payer: Self-pay

## 2023-05-21 NOTE — Telephone Encounter (Signed)
 Please see the message below.

## 2023-05-22 ENCOUNTER — Other Ambulatory Visit: Payer: Self-pay | Admitting: Family Medicine

## 2023-05-22 DIAGNOSIS — Z6833 Body mass index (BMI) 33.0-33.9, adult: Secondary | ICD-10-CM

## 2023-05-22 MED ORDER — ZEPBOUND 5 MG/0.5ML ~~LOC~~ SOAJ: 5.0000 mg | SUBCUTANEOUS | 1 refills | Status: DC

## 2023-05-22 NOTE — Telephone Encounter (Signed)
 Updated zepbound  dose to 5mg  weekly and sent to pharmacy

## 2023-05-29 ENCOUNTER — Telehealth: Payer: Self-pay

## 2023-05-29 NOTE — Telephone Encounter (Signed)
 Patient called to check status of Zepbound .  The last thing I see is the 2.5 mg dose being approved for 2 fills.  Patient has had the 5 mg sent to the pharmacy and needs PA done for it.  He was not able to get the 2.5 mg because I think he already got it twice in the year.  He had been taking his injections every other week at one or two different times.  I advised him not to do that and that we will try to get the 5 mg for him.   Thank you Simonne Dubonnet

## 2023-05-30 ENCOUNTER — Other Ambulatory Visit (HOSPITAL_COMMUNITY): Payer: Self-pay

## 2023-07-13 ENCOUNTER — Other Ambulatory Visit: Payer: Self-pay | Admitting: Family Medicine

## 2023-07-13 DIAGNOSIS — F410 Panic disorder [episodic paroxysmal anxiety] without agoraphobia: Secondary | ICD-10-CM

## 2023-07-13 NOTE — Telephone Encounter (Unsigned)
 Copied from CRM [id number]. Topic: Clinical - Medication Refill >> Jul 13, 2023 10:20 AM Willma R wrote: Medication: LORazepam  (ATIVAN ) 0.5 MG tablet  Has the patient contacted their pharmacy? Yes, call dr  This is the patient's preferred pharmacy:  [facility] [address] [place] - [address] [place] [address] Phone: [phone] Fax: [phone]  Is this the correct pharmacy for this prescription? Yes If no, delete pharmacy and type the correct one.   Has the prescription been filled recently? No  Is the patient out of the medication? No, 1 pill left  Has the patient been seen for an appointment in the last year OR does the patient have an upcoming appointment? Yes  Can we respond through MyChart? Yes  Agent: Please be advised that Rx refills may take up to 3 business days. We ask that you follow-up with your pharmacy.

## 2023-07-16 NOTE — Telephone Encounter (Signed)
 Requested medication (s) are due for refill today: yes  Requested medication (s) are on the active medication list: yes  Last refill:  05/03/23  Future visit scheduled: yes  Notes to clinic:  Unable to refill per protocol, cannot delegate.      Requested Prescriptions  Pending Prescriptions Disp Refills   LORazepam  (ATIVAN ) 0.5 MG tablet 30 tablet 0    Sig: Take 1 tablet (0.5 mg total) by mouth every 8 (eight) hours as needed (for severe anxiety).     Not Delegated - Psychiatry: Anxiolytics/Hypnotics 2 Failed - 07/16/2023 11:16 AM      Failed - This refill cannot be delegated      Failed - Urine Drug Screen completed in last 360 days      Passed - Patient is not pregnant      Passed - Valid encounter within last 6 months    Recent Outpatient Visits           4 months ago Respiratory infection   [facility] Gasper Nancyann BRAVO, MD

## 2023-07-30 ENCOUNTER — Other Ambulatory Visit: Payer: Self-pay | Admitting: Family Medicine

## 2023-07-30 DIAGNOSIS — F410 Panic disorder [episodic paroxysmal anxiety] without agoraphobia: Secondary | ICD-10-CM

## 2023-07-30 NOTE — Telephone Encounter (Signed)
 05/03/23 30 tabs/0 RF

## 2023-07-30 NOTE — Telephone Encounter (Unsigned)
 Copied from CRM [id number]. Topic: Clinical - Medication Refill >> Jul 30, 2023 10:37 AM Carlatta H wrote: Medication: LORazepam  (ATIVAN ) 0.5 MG tablet [[id number]]  Has the patient contacted their pharmacy? No (Agent: If no, request that the patient contact the pharmacy for the refill. If patient does not wish to contact the pharmacy document the reason why and proceed with request.) (Agent: If yes, when and what did the pharmacy advise?)  This is the patient's preferred pharmacy:  [facility] [address] [place] - [address] [place] [address] Phone: [phone] Fax: [phone]    Is this the correct pharmacy for this prescription? No If no, delete pharmacy and type the correct one.   Has the prescription been filled recently? No  Is the patient out of the medication? Yes  Has the patient been seen for an appointment in the last year OR does the patient have an upcoming appointment? Yes  Can we respond through MyChart? Yes  Agent: Please be advised that Rx refills may take up to 3 business days. We ask that you follow-up with your pharmacy.

## 2023-07-31 NOTE — Telephone Encounter (Signed)
 Requested medication (s) are due for refill today - yes  Requested medication (s) are on the active medication list -yes  Future visit scheduled -no  Last refill: 05/03/23 #30  Notes to clinic: non delegated Rx  Requested Prescriptions  Pending Prescriptions Disp Refills   LORazepam  (ATIVAN ) 0.5 MG tablet 30 tablet 0    Sig: Take 1 tablet (0.5 mg total) by mouth every 8 (eight) hours as needed (for severe anxiety).     Not Delegated - Psychiatry: Anxiolytics/Hypnotics 2 Failed - 07/31/2023  4:09 PM      Failed - This refill cannot be delegated      Failed - Urine Drug Screen completed in last 360 days      Passed - Patient is not pregnant      Passed - Valid encounter within last 6 months    Recent Outpatient Visits           5 months ago Respiratory infection   [facility] Gasper Nancyann BRAVO, MD                 Requested Prescriptions  Pending Prescriptions Disp Refills   LORazepam  (ATIVAN ) 0.5 MG tablet 30 tablet 0    Sig: Take 1 tablet (0.5 mg total) by mouth every 8 (eight) hours as needed (for severe anxiety).     Not Delegated - Psychiatry: Anxiolytics/Hypnotics 2 Failed - 07/31/2023  4:09 PM      Failed - This refill cannot be delegated      Failed - Urine Drug Screen completed in last 360 days      Passed - Patient is not pregnant      Passed - Valid encounter within last 6 months    Recent Outpatient Visits           5 months ago Respiratory infection   [facility] Gasper Nancyann BRAVO, MD

## 2023-08-01 MED ORDER — LORAZEPAM 0.5 MG PO TABS: 0.5000 mg | ORAL_TABLET | Freq: Three times a day (TID) | ORAL | 0 refills | Status: DC | PRN

## 2023-08-14 ENCOUNTER — Other Ambulatory Visit: Payer: Self-pay | Admitting: Family Medicine

## 2023-08-14 DIAGNOSIS — F410 Panic disorder [episodic paroxysmal anxiety] without agoraphobia: Secondary | ICD-10-CM

## 2023-08-18 ENCOUNTER — Other Ambulatory Visit: Payer: Self-pay | Admitting: Physician Assistant

## 2023-08-18 DIAGNOSIS — I10 Essential (primary) hypertension: Secondary | ICD-10-CM

## 2023-08-22 ENCOUNTER — Other Ambulatory Visit: Payer: Self-pay | Admitting: Family Medicine

## 2023-08-22 DIAGNOSIS — I10 Essential (primary) hypertension: Secondary | ICD-10-CM

## 2023-08-22 NOTE — Telephone Encounter (Unsigned)
 Copied from CRM [id number]. Topic: Clinical - Medication Refill >> Aug 22, 2023 11:33 AM Fonda T wrote: Medication: amlodipine -benazepril  (LOTREL) 2.5-10 MG capsule  Has the patient contacted their pharmacy? Yes, advised patient to contact provider  This is the patient's preferred pharmacy:  [facility] [address], [place] - [address] [place] [address] Phone: [phone] Fax: [phone]   Is this the correct pharmacy for this prescription? Yes If no, delete pharmacy and type the correct one.   Has the prescription been filled recently? Yes  Is the patient out of the medication? Yes  Has the patient been seen for an appointment in the last year OR does the patient have an upcoming appointment? Yes, patient next scheduled appointment is 10/19/23  Can we respond through MyChart? Yes  Agent: Please be advised that Rx refills may take up to 3 business days. We ask that you follow-up with your pharmacy.

## 2023-08-24 MED ORDER — AMLODIPINE BESY-BENAZEPRIL HCL 2.5-10 MG PO CAPS: 1.0000 | ORAL_CAPSULE | Freq: Every day | ORAL | 0 refills | Status: DC

## 2023-08-24 NOTE — Telephone Encounter (Signed)
 Requested medications are due for refill today.  yes  Requested medications are on the active medications list.  yes  Last refill. 07/14/2022 #90 3 rf  Future visit scheduled.   yes  Notes to clinic.  Rx  signed by Manuelita Flatness.    Requested Prescriptions  Pending Prescriptions Disp Refills   amlodipine -benazepril  (LOTREL) 2.5-10 MG capsule 90 capsule 3    Sig: Take 1 capsule by mouth daily.     Cardiovascular: CCB + ACEI Combos Failed - 08/24/2023 10:50 AM      Failed - Cr in normal range and within 180 days    Creatinine, Ser  Date Value Ref Range Status  07/14/2022 1.10 0.76 - 1.27 mg/dL Final         Failed - K in normal range and within 180 days    Potassium  Date Value Ref Range Status  07/14/2022 4.5 3.5 - 5.2 mmol/L Final         Failed - Na in normal range and within 180 days    Sodium  Date Value Ref Range Status  07/14/2022 140 134 - 144 mmol/L Final         Failed - eGFR is 30 or above and within 180 days    GFR calc Af Amer  Date Value Ref Range Status  10/11/2018 98 >59 mL/min/1.73 Final   GFR calc non Af Amer  Date Value Ref Range Status  10/11/2018 85 >59 mL/min/1.73 Final   eGFR  Date Value Ref Range Status  07/14/2022 90 >59 mL/min/1.73 Final         Passed - Patient is not pregnant      Passed - Last BP in normal range    BP Readings from Last 1 Encounters:  02/26/23 126/88         Passed - Valid encounter within last 6 months    Recent Outpatient Visits           5 months ago Respiratory infection   [facility] Gasper Nancyann BRAVO, MD

## 2023-10-19 ENCOUNTER — Encounter: Payer: Self-pay | Admitting: Family Medicine

## 2023-10-19 ENCOUNTER — Ambulatory Visit: Admitting: Family Medicine

## 2023-10-19 VITALS — BP 120/72 | HR 90 | Ht 72.0 in | Wt 218.0 lb

## 2023-10-19 DIAGNOSIS — Z6829 Body mass index (BMI) 29.0-29.9, adult: Secondary | ICD-10-CM

## 2023-10-19 DIAGNOSIS — F410 Panic disorder [episodic paroxysmal anxiety] without agoraphobia: Secondary | ICD-10-CM

## 2023-10-19 DIAGNOSIS — E663 Overweight: Secondary | ICD-10-CM

## 2023-10-19 DIAGNOSIS — K219 Gastro-esophageal reflux disease without esophagitis: Secondary | ICD-10-CM | POA: Diagnosis not present

## 2023-10-19 DIAGNOSIS — I10 Essential (primary) hypertension: Secondary | ICD-10-CM

## 2023-10-19 MED ORDER — LORAZEPAM 0.5 MG PO TABS
0.5000 mg | ORAL_TABLET | Freq: Three times a day (TID) | ORAL | 2 refills | Status: AC | PRN
Start: 2023-10-19 — End: ?

## 2023-10-19 MED ORDER — AMLODIPINE BESY-BENAZEPRIL HCL 2.5-10 MG PO CAPS: 1.0000 | ORAL_CAPSULE | Freq: Every day | ORAL | 1 refills | Status: AC

## 2023-10-19 NOTE — Progress Notes (Signed)
 Established patient visit   Patient: Jason Stanton   DOB: 02-Jun-1985   38 y.o. Male  MRN: [id number] Visit Date: 10/19/2023  Today's healthcare provider: Rockie Agent, MD   Chief Complaint  Patient presents with   Medical Management of Chronic Issues    Patient is present for follow up of blood pressure and medication. Needing reefills od amlodipine  and lorazepam   today    Hypertension    Patient reports not checking BP at home, no headache, CP, blurred vision or dizziness. Takes every morning    Weight Check    Patient has refills left of zepbound     Subjective     HPI     Medical Management of Chronic Issues    Additional comments: Patient is present for follow up of blood pressure and medication. Needing reefills od amlodipine  and lorazepam   today         Hypertension    Additional comments: Patient reports not checking BP at home, no headache, CP, blurred vision or dizziness. Takes every morning         Weight Check    Additional comments: Patient has refills left of zepbound        Last edited by Cherry Chiquita HERO, CMA on 10/19/2023  2:54 PM.       Discussed the use of AI scribe software for clinical note transcription with the patient, who gave verbal consent to proceed.  History of Present Illness Jason Stanton is a 38 year old male with chronic obesity and hypertension who presents for a follow-up visit.  He is managing chronic obesity with Zepbound  5 mg once weekly, resulting in a weight reduction from 225 pounds to 218 pounds over the past year. His BMI is currently 29, placing him in the overweight category. Due to severe heartburn from the medication, he adjusted the dosing to every other week, maintaining a weight between 212 to 218 pounds. Extending the interval to three weeks resulted in weight gain. Spacing the doses alleviates the heartburn.  He has chronic hypertension, managed with amlodipine  2.5 mg and benazepril  2 mg  daily. His recent blood pressure readings have been 118/86 mmHg. He attributes stress from a busy lifestyle as a potential factor affecting his blood pressure.  He is managing panic disorder with Ativan  0.5 mg every eight hours. Increased stress from family responsibilities, including caring for his parents and managing a busy household with three children, contributes to occasional panic attacks, primarily at home. He uses Ativan  to manage these episodes and notes that his medication supply is running low.  He has reduced alcohol consumption to Friday nights, which he believes has positively impacted his health. He also reports improvements in sleep apnea symptoms, noting a reduction in snoring.  He lives next to his parents and is actively involved in their care, especially since his father recently had surgery and his mother is dealing with graft-versus-host disease. He works full-time, as does his wife, who is a Engineer, site. He has three children, and he is involved in household responsibilities, including managing an addition to his home.     Past Medical History:  Diagnosis Date   Heart murmur     Medications: Outpatient Medications Prior to Visit  Medication Sig   cyclobenzaprine  (FLEXERIL ) 5 MG tablet Take 1 tablet (5 mg total) by mouth 3 (three) times daily as needed for muscle spasms.   diclofenac  (VOLTAREN ) 75 MG EC tablet Take 1 tablet (75 mg total) by  mouth 2 (two) times daily.   promethazine -dextromethorphan (PROMETHAZINE -DM) 6.25-15 MG/5ML syrup Take 5 mLs by mouth 4 (four) times daily as needed for cough.   tirzepatide  (ZEPBOUND ) 5 MG/0.5ML Pen Inject 5 mg into the skin once a week.   [DISCONTINUED] amlodipine -benazepril  (LOTREL) 2.5-10 MG capsule Take 1 capsule by mouth daily.   [DISCONTINUED] LORazepam  (ATIVAN ) 0.5 MG tablet Take 1 tablet (0.5 mg total) by mouth every 8 (eight) hours as needed (for severe anxiety).   No facility-administered medications prior to visit.     Review of Systems      Objective    BP 120/72 (Cuff Size: Normal)   Pulse 90   Ht 6' (1.829 m)   Wt 218 lb (98.9 kg)   SpO2 97%   BMI 29.57 kg/m  BP Readings from Last 3 Encounters:  10/19/23 120/72  02/26/23 126/88  01/04/23 122/85   Wt Readings from Last 3 Encounters:  10/19/23 218 lb (98.9 kg)  01/04/23 226 lb (102.5 kg)  10/18/22 225 lb (102.1 kg)        Physical Exam Vitals reviewed.  Constitutional:      General: He is not in acute distress.    Appearance: Normal appearance. He is not ill-appearing.  Cardiovascular:     Rate and Rhythm: Normal rate and regular rhythm.  Pulmonary:     Effort: Pulmonary effort is normal. No respiratory distress.     Breath sounds: No wheezing, rhonchi or rales.  Neurological:     Mental Status: He is alert and oriented to person, place, and time.  Psychiatric:        Mood and Affect: Mood normal.        Behavior: Behavior normal.       No results found for any visits on 10/19/23.  Assessment & Plan     Problem List Items Addressed This Visit     BMI 29.0-29.9,adult   Overweight BMI decreased to 29 with weight reduction from 225 lbs to 218 lbs over the past year. Zepbound  5 mg weekly effective for weight loss but causes significant heartburn. Adjusted to every other week, maintaining weight between 212 to 218 lbs. Weight gain noted when extending to three weeks. Improvement in sleep apnea symptoms with weight loss. - Continue Zepbound  5 mg every other week. - Monitor for heartburn and consider Protonix if symptoms persist      Essential hypertension - Primary   Essential hypertension Chronic  Blood pressure borderline controlled at 118/86 mmHg. On amlodipine  2.5 mg and benazepril  10 mg daily. Stress and lack of sleep may contribute to elevated diastolic pressure. - Continue amlodipine  and benazepril . - Reassess blood pressure control at next visit.      Relevant Medications   amlodipine -benazepril  (LOTREL)  2.5-10 MG capsule   Panic disorder   Panic disorder Chronic  Increased stress from family responsibilities and home life leading to occasional panic attacks. On Ativan  0.5 mg every 8 hours as needed, used more frequently during stress. - Refill Ativan  prescription with additional refills. - Monitor frequency of panic attacks.      Relevant Medications   LORazepam  (ATIVAN ) 0.5 MG tablet   Other Visit Diagnoses       Gastroesophageal reflux disease, unspecified whether esophagitis present            Assessment & Plan .         Return in about 4 months (around 02/19/2024) for CPE.         Rockie Agent, MD  [facility] [phone] (phone) [phone] (fax)  [facility]

## 2023-10-19 NOTE — Patient Instructions (Signed)
 To keep you healthy, please keep in mind the following health maintenance items that you are due for:   Health Maintenance Due  Topic Date Due   Hepatitis C Screening  Never done   DTaP/Tdap/Td (1 - Tdap) Never done   Pneumococcal Vaccine (1 of 2 - PCV) Never done   Hepatitis B Vaccines 19-59 Average Risk (1 of 3 - 19+ 3-dose series) Never done   HPV VACCINES (1 - 3-dose SCDM series) Never done     Best Wishes,   Dr. Lang

## 2023-10-21 NOTE — Assessment & Plan Note (Signed)
 Overweight BMI decreased to 29 with weight reduction from 225 lbs to 218 lbs over the past year. Zepbound  5 mg weekly effective for weight loss but causes significant heartburn. Adjusted to every other week, maintaining weight between 212 to 218 lbs. Weight gain noted when extending to three weeks. Improvement in sleep apnea symptoms with weight loss. - Continue Zepbound  5 mg every other week. - Monitor for heartburn and consider Protonix if symptoms persist

## 2023-10-21 NOTE — Assessment & Plan Note (Signed)
 Panic disorder Chronic  Increased stress from family responsibilities and home life leading to occasional panic attacks. On Ativan  0.5 mg every 8 hours as needed, used more frequently during stress. - Refill Ativan  prescription with additional refills. - Monitor frequency of panic attacks.

## 2023-10-21 NOTE — Assessment & Plan Note (Signed)
 Essential hypertension Chronic  Blood pressure borderline controlled at 118/86 mmHg. On amlodipine  2.5 mg and benazepril  10 mg daily. Stress and lack of sleep may contribute to elevated diastolic pressure. - Continue amlodipine  and benazepril . - Reassess blood pressure control at next visit.

## 2024-01-18 ENCOUNTER — Ambulatory Visit: Payer: Self-pay | Admitting: Family Medicine

## 2024-01-18 ENCOUNTER — Encounter: Payer: Self-pay | Admitting: Family Medicine

## 2024-01-18 ENCOUNTER — Ambulatory Visit: Admitting: Family Medicine

## 2024-01-18 ENCOUNTER — Telehealth: Payer: Self-pay | Admitting: Pharmacy Technician

## 2024-01-18 ENCOUNTER — Other Ambulatory Visit (HOSPITAL_COMMUNITY): Payer: Self-pay

## 2024-01-18 VITALS — BP 116/70 | HR 94 | Temp 98.1°F | Wt 228.2 lb

## 2024-01-18 DIAGNOSIS — M2550 Pain in unspecified joint: Secondary | ICD-10-CM | POA: Diagnosis not present

## 2024-01-18 LAB — CBC WITH DIFFERENTIAL/PLATELET
Basophils Absolute: 0 K/uL (ref 0.0–0.1)
Basophils Relative: 0.6 % (ref 0.0–3.0)
Eosinophils Absolute: 0.3 K/uL (ref 0.0–0.7)
Eosinophils Relative: 3.9 % (ref 0.0–5.0)
HCT: 45.7 % (ref 39.0–52.0)
Hemoglobin: 15.7 g/dL (ref 13.0–17.0)
Lymphocytes Relative: 33.5 % (ref 12.0–46.0)
Lymphs Abs: 2.8 K/uL (ref 0.7–4.0)
MCHC: 34.5 g/dL (ref 30.0–36.0)
MCV: 93.8 fl (ref 78.0–100.0)
Monocytes Absolute: 0.8 K/uL (ref 0.1–1.0)
Monocytes Relative: 10 % (ref 3.0–12.0)
Neutro Abs: 4.3 K/uL (ref 1.4–7.7)
Neutrophils Relative %: 52 % (ref 43.0–77.0)
Platelets: 244 K/uL (ref 150.0–400.0)
RBC: 4.87 Mil/uL (ref 4.22–5.81)
RDW: 12.9 % (ref 11.5–15.5)
WBC: 8.2 K/uL (ref 4.0–10.5)

## 2024-01-18 LAB — C-REACTIVE PROTEIN: CRP: <0.5 mg/dL — ABNORMAL LOW (ref 1.0–20.0)

## 2024-01-18 LAB — SEDIMENTATION RATE: Sed Rate: 4 mm/h (ref 0–15)

## 2024-01-18 NOTE — Progress Notes (Signed)
 "  Established Patient Office Visit  Subjective   Patient ID: Jason Stanton, male    DOB: 07-20-1985  Age: 39 y.o. MRN: [id number]  Chief Complaint  Patient presents with   Joint Pain    HPI   Jason Stanton is seen today as a work and with some arthralgias.  He has history of hypertension and reported obstructive sleep apnea.  Generally healthy.  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  Does very physical work.  He has noticed recently joint pain in multiple joints including knees, wrist, shoulders, elbows.  He has particularly noticed some soreness around the tibial tubercle bilaterally.  Denies any injury.  Has been using Aleve.  Strong family history of rheumatoid arthritis mother and brother.  He has not noted any objective redness or visible swelling.  No recent fever.  No skin rashes.  Denies any history of tick bite  His medications are reviewed and include Lotrel for hypertension and Zepbound  which she has taken for weight management and to help with his sleep apnea history.  He has diclofenac  75 mg on his med list but has not been taking this recently.  Past Medical History:  Diagnosis Date   Heart murmur    Past Surgical History:  Procedure Laterality Date   VASECTOMY      reports that he has been smoking cigarettes. He started smoking about 5 years ago. He has a 1 pack-year smoking history. He has never used smokeless tobacco. He reports current alcohol use of about 1.0 - 2.0 standard drink of alcohol per week. He reports that he does not use drugs. family history includes Cancer in his mother. Allergies[1]  Review of Systems  Constitutional:  Negative for chills, fever and malaise/fatigue.  Eyes:  Negative for blurred vision.  Respiratory:  Negative for shortness of breath.   Cardiovascular:  Negative for chest pain.  Musculoskeletal:  Positive for joint pain.  Skin:  Negative for rash.  Neurological:  Negative for  headaches.      Objective:     BP 116/70   Pulse 94   Temp 98.1 F (36.7 C) (Oral)   Wt 228 lb 3.2 oz (103.5 kg)   SpO2 98%   BMI 30.95 kg/m  BP Readings from Last 3 Encounters:  01/18/24 116/70  10/19/23 120/72  02/26/23 126/88   Wt Readings from Last 3 Encounters:  01/18/24 228 lb 3.2 oz (103.5 kg)  10/19/23 218 lb (98.9 kg)  01/04/23 226 lb (102.5 kg)      Physical Exam Vitals reviewed.  Constitutional:      General: He is not in acute distress.    Appearance: He is not ill-appearing.  Cardiovascular:     Rate and Rhythm: Normal rate and regular rhythm.     Heart sounds: No murmur heard. Pulmonary:     Effort: Pulmonary effort is normal.     Breath sounds: Normal breath sounds.  Musculoskeletal:     Comments: No appreciable joint warmth or any visible effusions.  No erythema.  Slightly prominent tibial tubercle bilaterally with mild tenderness to palpation bilaterally.  Neurological:     Mental Status: He is alert.      No results found for any visits on 01/18/24.    The ASCVD Risk score (Arnett DK, et al., 2019) failed to calculate for the following reasons:   The 2019 ASCVD risk score is only valid for ages 65 to 58   * -  Cholesterol units were assumed    Assessment & Plan:   Problem List Items Addressed This Visit   None Visit Diagnoses       Polyarthralgia    -  Primary   Relevant Orders   CBC with Differential/Platelet   ANA   Sedimentation rate   C-reactive Protein   Rheumatoid factor   Cyclic citrul peptide antibody, IgG     Patient presents with polyarthralgias involving multiple joints.  Strong family history of rheumatoid arthritis in his brother and mother.  No objective evidence for joint inflammation at this time.  Check multiple labs as above.  Probably has element of patellar tendinitis bilaterally.  Consider trial over-the-counter diclofenac /Voltaren  gel to use 3-4 times daily as needed.  No follow-ups on file.    Wolm Scarlet, MD     [1]  Allergies Allergen Reactions   Stadol [Butorphanol] Anaphylaxis   "

## 2024-01-18 NOTE — Telephone Encounter (Signed)
 Pharmacy Patient Advocate Encounter   Received notification from Onbase that prior authorization for Zepbound  5 mg/0.53ml is required/requested.   Insurance verification completed.   The patient is insured through [employer].   Per test claim: Program Participation Required    **According to this reject message, patient will need to contact plan for more information and we can submit renewal PA 24 hours after patient has enrolled**

## 2024-01-18 NOTE — Patient Instructions (Signed)
 Consider trial of voltaren /diclofenac  gel apply to knees 3-4 times daily.

## 2024-01-21 ENCOUNTER — Telehealth: Payer: Self-pay

## 2024-01-21 ENCOUNTER — Other Ambulatory Visit (HOSPITAL_COMMUNITY): Payer: Self-pay

## 2024-01-21 LAB — ANTI-NUCLEAR AB-TITER (ANA TITER): ANA Titer 1: 1:40 {titer} — ABNORMAL HIGH

## 2024-01-21 LAB — RHEUMATOID FACTOR: Rheumatoid fact SerPl-aCnc: <10 [IU]/mL

## 2024-01-21 LAB — CYCLIC CITRUL PEPTIDE ANTIBODY, IGG: Cyclic Citrullin Peptide Ab: <16 U

## 2024-01-21 LAB — ANA: Anti Nuclear Antibody (ANA): POSITIVE — AB

## 2024-01-21 NOTE — Telephone Encounter (Signed)
 Copied from CRM [id number]. Topic: Clinical - Lab/Test Results >> Jan 21, 2024  9:51 AM Winona R wrote: Pt requesting for his provider to review his labs he had done on 01/02 and please reach out to him with suggestions on moving forward.

## 2024-01-30 ENCOUNTER — Ambulatory Visit: Admitting: Family Medicine

## 2024-01-30 ENCOUNTER — Encounter: Payer: Self-pay | Admitting: Family Medicine

## 2024-01-30 VITALS — BP 104/72 | HR 103 | Ht 72.0 in | Wt 223.5 lb

## 2024-01-30 DIAGNOSIS — E66811 Obesity, class 1: Secondary | ICD-10-CM | POA: Diagnosis not present

## 2024-01-30 DIAGNOSIS — G4733 Obstructive sleep apnea (adult) (pediatric): Secondary | ICD-10-CM | POA: Diagnosis not present

## 2024-01-30 DIAGNOSIS — Z6833 Body mass index (BMI) 33.0-33.9, adult: Secondary | ICD-10-CM | POA: Diagnosis not present

## 2024-01-30 DIAGNOSIS — M2550 Pain in unspecified joint: Secondary | ICD-10-CM | POA: Diagnosis not present

## 2024-01-30 DIAGNOSIS — M545 Low back pain, unspecified: Secondary | ICD-10-CM

## 2024-01-30 MED ORDER — ZEPBOUND 5 MG/0.5ML ~~LOC~~ SOAJ: 5.0000 mg | SUBCUTANEOUS | 1 refills | Status: AC

## 2024-01-30 MED ORDER — PREDNISONE 20 MG PO TABS: 20.0000 mg | ORAL_TABLET | Freq: Every day | ORAL | 0 refills | Status: AC

## 2024-01-30 NOTE — Patient Instructions (Signed)
 To keep you healthy, please keep in mind the following health maintenance items that you are due for:   Health Maintenance Due  Topic Date Due   Hepatitis C Screening  Never done   DTaP/Tdap/Td (1 - Tdap) Never done   Pneumococcal Vaccine (1 of 2 - PCV) Never done   Hepatitis B Vaccines 19-59 Average Risk (1 of 3 - 19+ 3-dose series) Never done   HPV VACCINES (1 - 3-dose SCDM series) Never done   COVID-19 Vaccine (3 - 2025-26 season) 09/17/2023     Best Wishes,   Dr. Lang

## 2024-01-30 NOTE — Assessment & Plan Note (Signed)
 Polyarthralgia Chronic joint pain in knees, wrists, shoulders, and elbows with tenderness over tibial tuberosities and right shoulder. No erythema, swelling, or deformities. Negative anti-CCP antibodies and rheumatoid factor. CRP was less than 0.5. Differential includes mixed connective tissue disease, lupus, Sjogren's, dermatomyositis, polymyositis, and ankylosing spondylitis. Positive ANA with speckled pattern. Family history of rheumatoid arthritis. Symptoms affect quality of life and daily activities. - Ordered CK, liver function tests, alkaline phosphatase, HLA B27, and connective tissue specific antibody tests. - Ordered x-rays of knees, shoulders, and hands. - Prescribed prednisone  20 mg once daily for 7 days. - Referred to rheumatology for further evaluation.

## 2024-01-30 NOTE — Assessment & Plan Note (Signed)
 Obstructive sleep apnea Chronic  - Continue current management with Zepbound  5mg  biweekly

## 2024-01-30 NOTE — Progress Notes (Signed)
 "  Established Patient Office Visit  Patient ID: Jason Stanton, male    DOB: 01/30/1985  Age: 39 y.o. MRN: [id number]  PCP: Sharma Coyer, MD  Chief Complaint  Patient presents with   Follow-up    Patient is present to discuss referral for rheumatology for joint pain and lab result. He was last seen in office on 01/18/24. Would like to see Maya KATHEE Nash if possible    Subjective:     HPI  Discussed the use of AI scribe software for clinical note transcription with the patient, who gave verbal consent to proceed.  History of Present Illness Jason Stanton is a 39 year old male who presents with polyarthralgia.  He has persistent joint pain affecting his knees, wrists, shoulders, and elbows, with soreness around the tibial tuberosities bilaterally. These symptoms have been present for a long time but have worsened over the last six months, impacting his ability to play with his four-year-old child. 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  He describes having to sleep with his hands together to avoid wrist pain upon waking. His knees and ankles are consistently painful, and he experiences shoulder pain, particularly in the area where the shoulder rotates. Certain activities, such as working for extended periods or specific sleeping positions, exacerbate the pain. He also reports low back pain, which he manages with a heating pad and ice for his knees. Standing helps alleviate his back pain.  He has a significant family history of rheumatoid arthritis, with his mother and brother affected. No redness, swelling, fever, rashes, or history of tick bites. He has been treated with Zepbound  for obesity and takes amlodipine  and benazepril  for hypertension.  He recalls a past incident where he experienced a popping sensation in his hand during a physical  activity, which has since caused intermittent pain with certain motions. He has tried medications like Flexeril  and gabapentin  in the past, but they either worsened his symptoms or caused adverse effects like numbness in his right foot.  He reports difficulty sleeping due to pain, requiring multiple pillows for support and waking frequently to adjust positions. Stiffness improves throughout the day but is exacerbated by certain activities. He also mentions a history of a pinched nerve and a slipped disc in his back, which were previously treated with a nerve blocker that worsened his symptoms.  He reports that his ANA was positive with a speckled pattern, and that his anti-CCP antibodies, rheumatoid factor, CRP, and sedimentation rate were negative or low, based on prior lab results. His symptoms affect his quality of life and his ability to engage in activities with his children.   Patient Active Problem List   Diagnosis Date Noted   OSA (obstructive sleep apnea) 01/30/2024   Polyarthralgia 01/30/2024   Obesity, Class I, BMI 30-34.9 07/14/2022   Panic disorder 12/03/2020   Essential hypertension 02/08/2018   Past Medical History:  Diagnosis Date   Heart murmur    Allergies[1]    ROS    Objective:     BP 104/72 (BP Location: Right Arm, Patient Position: Sitting, Cuff Size: Large)   Pulse (!) 103   Ht 6' (1.829 m)   Wt 223 lb 8 oz (101.4 kg)   SpO2 100%   BMI 30.31 kg/m  BP Readings from Last 3 Encounters:  01/30/24 104/72  01/18/24 116/70  10/19/23 120/72   Wt  Readings from Last 3 Encounters:  01/30/24 223 lb 8 oz (101.4 kg)  01/18/24 228 lb 3.2 oz (103.5 kg)  10/19/23 218 lb (98.9 kg)      Physical Exam VITALS: P- 103, BP- 104/72 MEASUREMENTS: Weight- 209. MUSCULOSKELETAL: Tenderness of the right shoulder is greater than the left on the anterior aspect. Normal range of motion of bilateral shoulders. Tenderness over the [facility] joint and diffuse lift of the right shoulder  joint. No radial pulses palpable laterally. Normal range of motion of bilateral lower extremities. No erythema or joint effusion of both knees. Tibial tuberosities tender to palpation. No crampiness or deformities of knees, shoulders, or hands. 5/5 muscle strength in bilateral hands with normal range of motion and 5/5 grip strength.   No results found for any visits on 01/30/24.  Last CBC Lab Results  Component Value Date   WBC 8.2 01/18/2024   HGB 15.7 01/18/2024   HCT 45.7 01/18/2024   MCV 93.8 01/18/2024   MCH 32.5 07/14/2022   RDW 12.9 01/18/2024   PLT 244.0 01/18/2024   Last metabolic panel Lab Results  Component Value Date   GLUCOSE 87 07/14/2022   NA 140 07/14/2022   K 4.5 07/14/2022   CL 104 07/14/2022   CO2 21 07/14/2022   BUN 14 07/14/2022   CREATININE 1.10 07/14/2022   EGFR 90 07/14/2022   CALCIUM 9.6 07/14/2022   PROT 7.2 07/14/2022   ALBUMIN 4.7 07/14/2022   LABGLOB 2.5 07/14/2022   AGRATIO 1.8 12/03/2020   BILITOT 0.3 07/14/2022   ALKPHOS 69 07/14/2022   AST 75 (H) 07/14/2022   ALT 135 (H) 07/14/2022   ANIONGAP 11 08/16/2016   Last lipids Lab Results  Component Value Date   CHOL 160 10/11/2018   HDL 59 10/11/2018   LDLCALC 82 10/11/2018   TRIG 107 10/11/2018   CHOLHDL 2.7 10/11/2018   Last hemoglobin A1c Lab Results  Component Value Date   HGBA1C 5.2 07/14/2022   Last thyroid functions Lab Results  Component Value Date   TSH 2.070 10/11/2018   Last vitamin D No results found for: 25OHVITD2, 25OHVITD3, VD25OH Last vitamin B12 and Folate No results found for: VITAMINB12, FOLATE    The ASCVD Risk score (Arnett DK, et al., 2019) failed to calculate for the following reasons:   The 2019 ASCVD risk score is only valid for ages 28 to 50   * - Cholesterol units were assumed  Outpatient Encounter Medications as of 01/30/2024  Medication Sig   amlodipine -benazepril  (LOTREL) 2.5-10 MG capsule Take 1 capsule by mouth daily.    cyclobenzaprine  (FLEXERIL ) 5 MG tablet Take 1 tablet (5 mg total) by mouth 3 (three) times daily as needed for muscle spasms.   LORazepam  (ATIVAN ) 0.5 MG tablet Take 1 tablet (0.5 mg total) by mouth every 8 (eight) hours as needed (for severe anxiety).   predniSONE  (DELTASONE ) 20 MG tablet Take 1 tablet (20 mg total) by mouth daily with breakfast for 7 days.   [DISCONTINUED] tirzepatide  (ZEPBOUND ) 5 MG/0.5ML Pen Inject 5 mg into the skin once a week.   diclofenac  (VOLTAREN ) 75 MG EC tablet Take 1 tablet (75 mg total) by mouth 2 (two) times daily. (Patient not taking: Reported on 01/30/2024)   tirzepatide  (ZEPBOUND ) 5 MG/0.5ML Pen Inject 5 mg into the skin once a week.   No facility-administered encounter medications on file as of 01/30/2024.       Assessment & Plan:   Problem List Items Addressed This Visit  Obesity, Class I, BMI 30-34.9   Obesity, Class I, Chronic  Managed with Zepbound . Current weight is 209 lbs. Insurance issues with Zepbound  noted. - Refilled Zepbound  5mg  every other week prescription. - Advised to contact insurance regarding Zepbound  coverage.      Relevant Medications   tirzepatide  (ZEPBOUND ) 5 MG/0.5ML Pen   OSA (obstructive sleep apnea)   Obstructive sleep apnea Chronic  - Continue current management with Zepbound  5mg  biweekly       Relevant Medications   tirzepatide  (ZEPBOUND ) 5 MG/0.5ML Pen   Polyarthralgia - Primary   Polyarthralgia Chronic joint pain in knees, wrists, shoulders, and elbows with tenderness over tibial tuberosities and right shoulder. No erythema, swelling, or deformities. Negative anti-CCP antibodies and rheumatoid factor. CRP was less than 0.5. Differential includes mixed connective tissue disease, lupus, Sjogren's, dermatomyositis, polymyositis, and ankylosing spondylitis. Positive ANA with speckled pattern. Family history of rheumatoid arthritis. Symptoms affect quality of life and daily activities. - Ordered CK, liver function  tests, alkaline phosphatase, HLA B27, and connective tissue specific antibody tests. - Ordered x-rays of knees, shoulders, and hands. - Prescribed prednisone  20 mg once daily for 7 days. - Referred to rheumatology for further evaluation.      Relevant Medications   predniSONE  (DELTASONE ) 20 MG tablet   Other Relevant Orders   CMP14+EGFR   ANA+ENA+DNA/DS+Scl 70+SjoSSA/B   HLA-B27 antigen   RNP Antibodies   CK (Creatine Kinase)   Ambulatory referral to Rheumatology   DG Knee 4 Views W/Patella Left   DG Knee 4 Views W/Patella Right   DG Shoulder Left   DG Shoulder Right   DG Hand Complete Left   DG Hand Complete Right   Other Visit Diagnoses       Lumbar back pain       Relevant Medications   predniSONE  (DELTASONE ) 20 MG tablet   Other Relevant Orders   HLA-B27 antigen   CK (Creatine Kinase)   Ambulatory referral to Rheumatology     BMI 33.0-33.9,adult       Relevant Medications   tirzepatide  (ZEPBOUND ) 5 MG/0.5ML Pen       Assessment and Plan Assessment & Plan   Lumbar back pain Chronic low back pain exacerbated by prolonged sitting and relieved by standing. Previous evaluation suggested slipped disc and pinched nerves. Pain management includes heat application and stretching exercises.        Return in about 4 months (around 05/29/2024) for Chronic F/U, HTN, .    Rockie Agent, MD [facility]      [1]  Allergies Allergen Reactions   Stadol [Butorphanol] Anaphylaxis   "

## 2024-01-30 NOTE — Assessment & Plan Note (Signed)
 Obesity, Class I, Chronic  Managed with Zepbound . Current weight is 209 lbs. Insurance issues with Zepbound  noted. - Refilled Zepbound  5mg  every other week prescription. - Advised to contact insurance regarding Zepbound  coverage.

## 2024-02-01 ENCOUNTER — Ambulatory Visit
Admission: RE | Admit: 2024-02-01 | Discharge: 2024-02-01 | Disposition: A | Discharge status: Home / Self Care | Attending: Family Medicine | Admitting: Family Medicine

## 2024-02-01 ENCOUNTER — Ambulatory Visit
Admission: RE | Admit: 2024-02-01 | Discharge: 2024-02-01 | Disposition: A | Discharge status: Home / Self Care | Source: Ambulatory Visit | Attending: Family Medicine | Admitting: Family Medicine

## 2024-02-01 DIAGNOSIS — M2550 Pain in unspecified joint: Secondary | ICD-10-CM | POA: Insufficient documentation

## 2024-02-05 LAB — CMP14+EGFR
ALT: 41 IU/L (ref 0–44)
AST: 36 IU/L (ref 0–40)
Albumin: 4.9 g/dL (ref 4.1–5.1)
Alkaline Phosphatase: 62 IU/L (ref 47–123)
BUN/Creatinine Ratio: 12 (ref 9–20)
BUN: 12 mg/dL (ref 6–20)
Bilirubin Total: 0.5 mg/dL (ref 0.0–1.2)
CO2: 20 mmol/L (ref 20–29)
Calcium: 9.4 mg/dL (ref 8.7–10.2)
Chloride: 100 mmol/L (ref 96–106)
Creatinine, Ser: 1 mg/dL (ref 0.76–1.27)
Globulin, Total: 2.7 g/dL (ref 1.5–4.5)
Glucose: 107 mg/dL — ABNORMAL HIGH (ref 70–99)
Potassium: 4.7 mmol/L (ref 3.5–5.2)
Sodium: 137 mmol/L (ref 134–144)
Total Protein: 7.6 g/dL (ref 6.0–8.5)
eGFR: 99 mL/min/1.73

## 2024-02-05 LAB — ANA+ENA+DNA/DS+SCL 70+SJOSSA/B
ENA RNP Ab: <0.2 AI (ref 0.0–0.9)
ENA SM Ab Ser-aCnc: <0.2 AI (ref 0.0–0.9)
ENA SSA (RO) Ab: 0.3 AI (ref 0.0–0.9)
ENA SSB (LA) Ab: <0.2 AI (ref 0.0–0.9)
Scleroderma (Scl-70) (ENA) Antibody, IgG: <0.2 AI (ref 0.0–0.9)
dsDNA Ab: 2 [IU]/mL (ref 0–9)

## 2024-02-05 LAB — CK: Total CK: 245 U/L (ref 49–439)

## 2024-02-05 LAB — HLA-B27 ANTIGEN: HLA B27: POSITIVE

## 2024-02-07 ENCOUNTER — Ambulatory Visit: Payer: Self-pay | Admitting: Family Medicine

## 2024-02-22 ENCOUNTER — Encounter: Admitting: Family Medicine

## 2024-05-02 ENCOUNTER — Ambulatory Visit: Admitting: Internal Medicine

## 2024-05-30 ENCOUNTER — Ambulatory Visit: Admitting: Family Medicine
